# Patient Record
Sex: Male | Born: 1955
Health system: Southern US, Community
[De-identification: ages and names within clinical notes are randomized; demographics above are authoritative.]

## PROBLEM LIST (undated history)

## (undated) DIAGNOSIS — I10 Essential (primary) hypertension: Secondary | ICD-10-CM

## (undated) DIAGNOSIS — I2510 Atherosclerotic heart disease of native coronary artery without angina pectoris: Secondary | ICD-10-CM

## (undated) DIAGNOSIS — R112 Nausea with vomiting, unspecified: Secondary | ICD-10-CM

## (undated) DIAGNOSIS — M1990 Unspecified osteoarthritis, unspecified site: Secondary | ICD-10-CM

## (undated) DIAGNOSIS — Z86718 Personal history of other venous thrombosis and embolism: Secondary | ICD-10-CM

## (undated) DIAGNOSIS — G4730 Sleep apnea, unspecified: Secondary | ICD-10-CM

## (undated) DIAGNOSIS — I252 Old myocardial infarction: Secondary | ICD-10-CM

## (undated) DIAGNOSIS — Z9889 Other specified postprocedural states: Secondary | ICD-10-CM

## (undated) HISTORY — DX: Old myocardial infarction: I25.2

## (undated) HISTORY — PX: OTHER SURGICAL HISTORY: SHX169

---

## 2000-10-01 ENCOUNTER — Ambulatory Visit (HOSPITAL_BASED_OUTPATIENT_CLINIC_OR_DEPARTMENT_OTHER): Admission: RE | Admit: 2000-10-01 | Discharge: 2000-10-01 | Payer: Self-pay | Admitting: Family Medicine

## 2002-11-17 ENCOUNTER — Encounter: Payer: Self-pay | Admitting: Family Medicine

## 2002-11-17 ENCOUNTER — Ambulatory Visit (HOSPITAL_COMMUNITY): Admission: RE | Admit: 2002-11-17 | Discharge: 2002-11-17 | Payer: Self-pay | Admitting: Family Medicine

## 2003-12-01 ENCOUNTER — Ambulatory Visit (HOSPITAL_COMMUNITY): Admission: RE | Admit: 2003-12-01 | Discharge: 2003-12-01 | Payer: Self-pay | Admitting: Oncology

## 2003-12-02 ENCOUNTER — Inpatient Hospital Stay (HOSPITAL_COMMUNITY): Admission: EM | Admit: 2003-12-02 | Discharge: 2003-12-09 | Payer: Self-pay

## 2005-03-30 IMAGING — CT CT ANGIO CHEST
2 of 4 series · 19 of 30 positions shown · IV contrast (omnipaque)
Comparison: none

CLINICAL DATA: DVT, abnormal chest x-ray, shortness of breath with exertion.  Constipation, Lesya+ stools.
CHEST CT ANGIO WITH CONTRAST
Multidetector CT imaging of the chest was performed according to the protocol for detection of pulmonary embolism during IV bolus injection of 150 ml Omnipaque 300.  Coronal and sagittal plane reformatted images were also generated.  
There is atelectasis at the right base with a nodular density approximately 14 x 28 mm in cross-section.  It could represent a peripherally located pulmonary infarct.  There is a small associated pleural effusion.  
I do not see evidence for pulmonary embolic disease.  However, given the history of deep venous thrombosis, a pulmonary infarct must be strongly considered.   The cardiac size is mildly increased.  There is no obvious hilar or mediastinal lymphadenopathy.  No other infiltrates or pulmonary densities can be seen.
IMPRESSION
1.  Nodular peripheral wedge-shaped right lower lobe density; pulmonary infarct is not excluded.
2.  No evidence for pulmonary emboli.
3.  Mild cardiomegaly.
ABDOMEN CT WITH CONTRAST
Routine spiral CT of the abdomen was performed following administration of dilute oral contrast as well as intravenous contrast.

[Series 4: pe w/ lower ext · axial · 0.75mm/px · z∈[-524,-46]mm · 13 of 336 slices shown]
[im 26/336  lung]
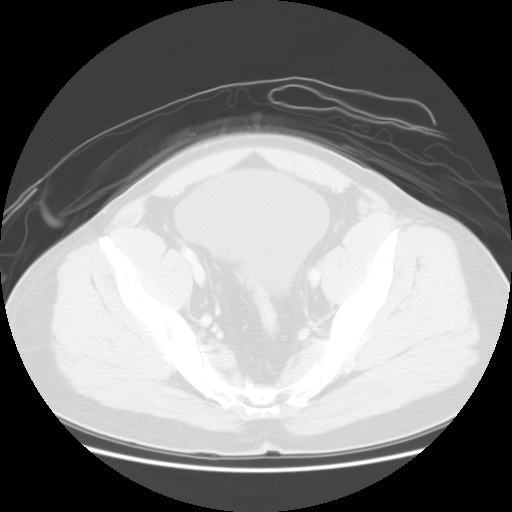
[im 52/336  mediastinal]
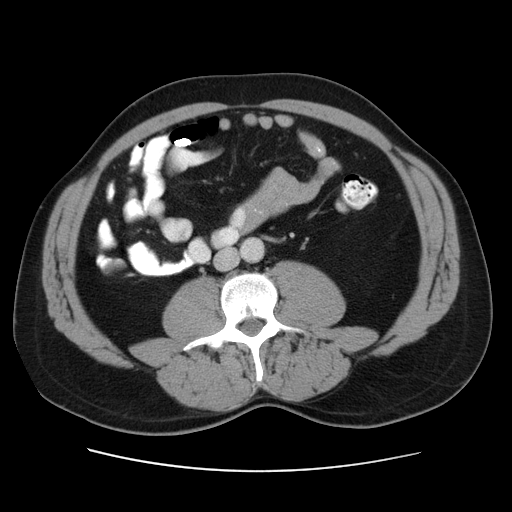
[im 71/336  lung]
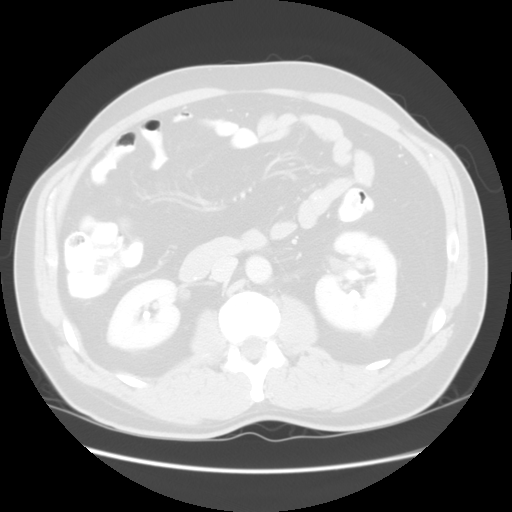
[im 78/336  mediastinal]
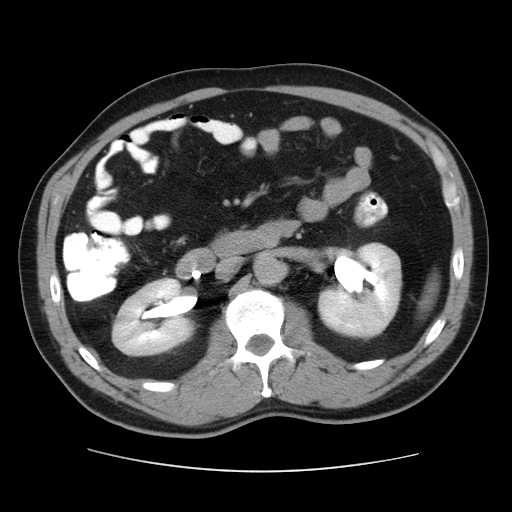
[im 104/336  lung]
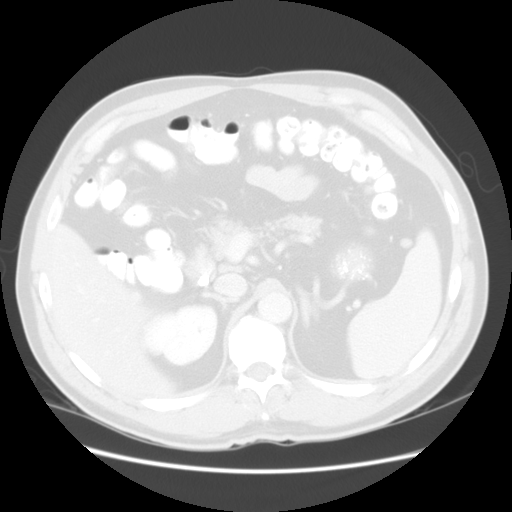
[im 129/336  mediastinal]
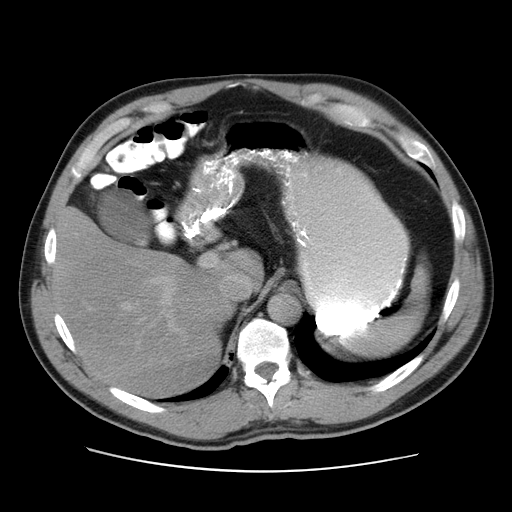
[im 155/336  lung]
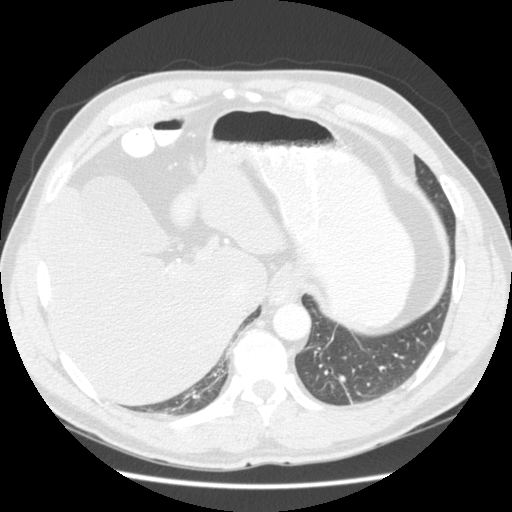
[im 181/336  mediastinal]
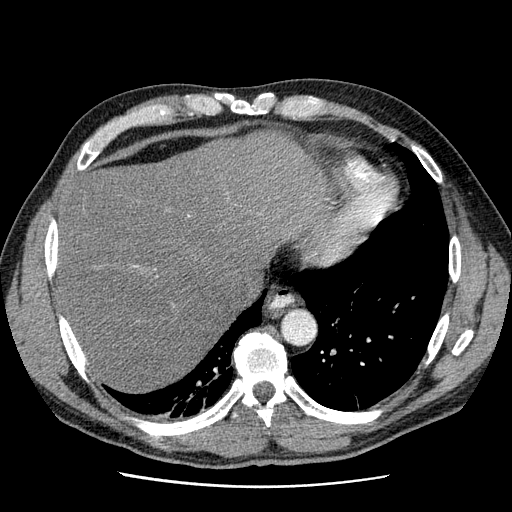
[im 207/336  lung]
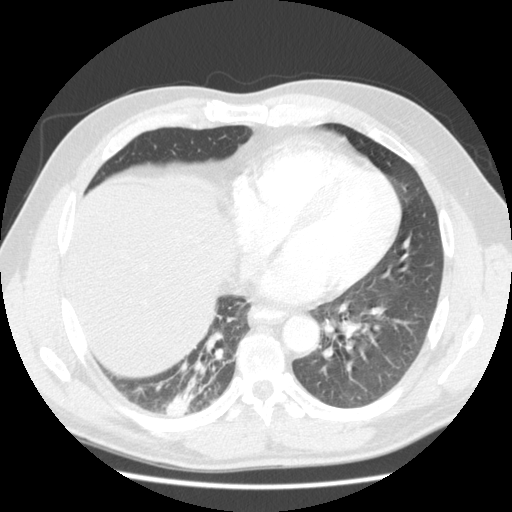
[im 232/336  mediastinal]
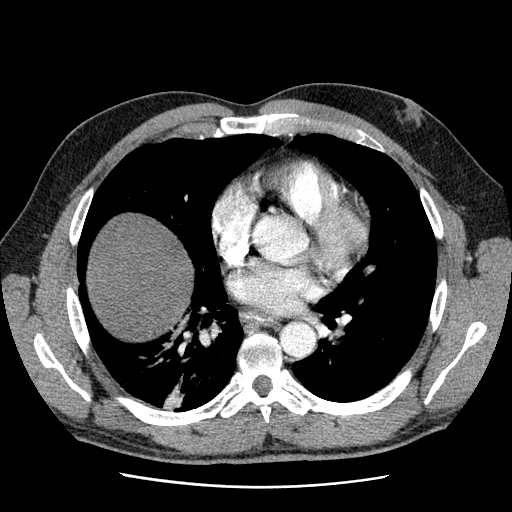
[im 258/336  lung]
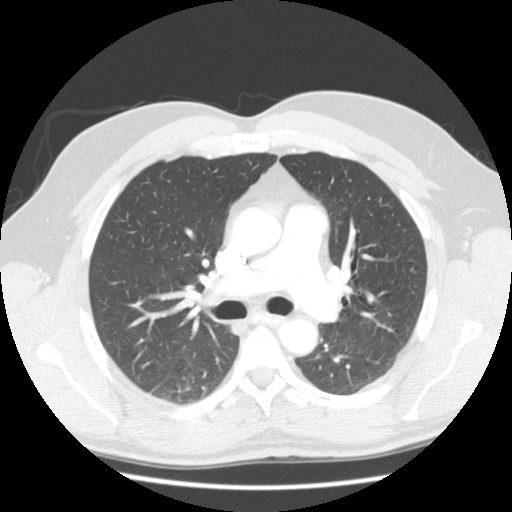
[im 284/336  mediastinal]
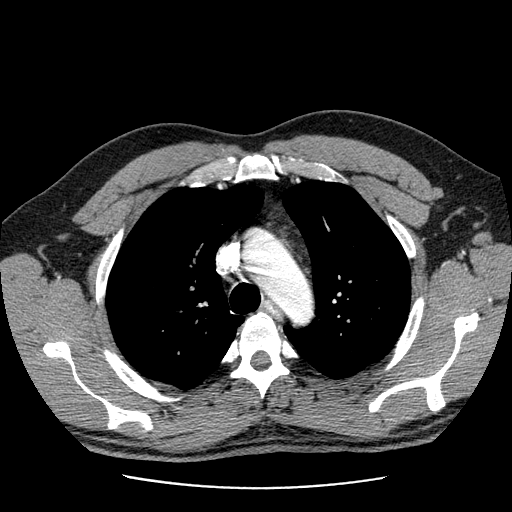
[im 310/336  lung]
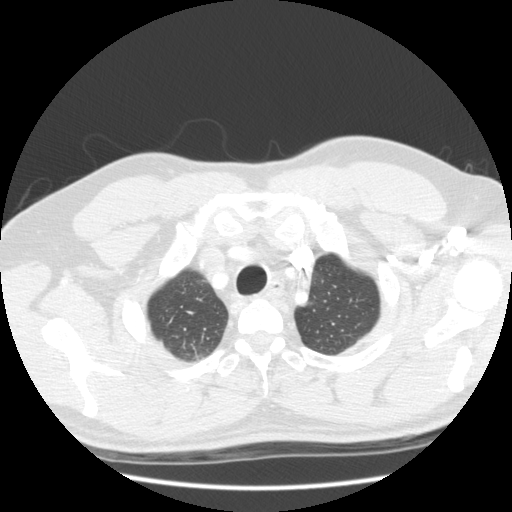

[Series 5: recon 2: pe w/ lower ext · axial · 0.70mm/px · z∈[-588,-246]mm · 6 of 261 slices shown]
[im 24/261  lung]
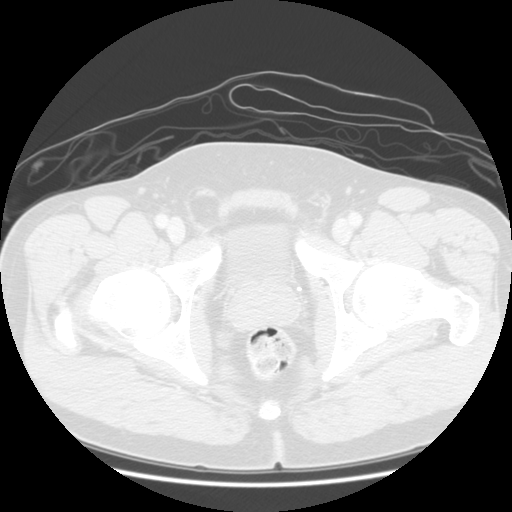
[im 71/261  lung]
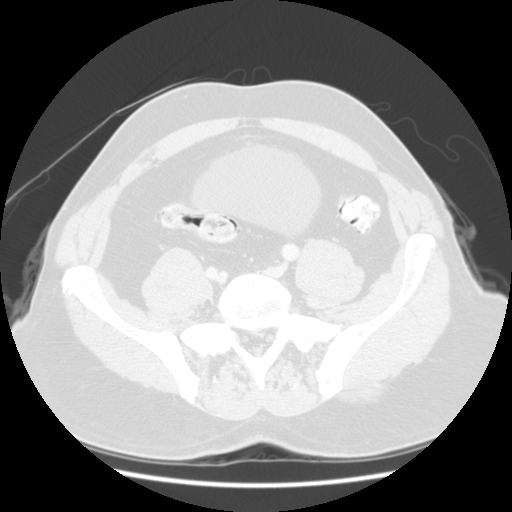
[im 95/261  lung]
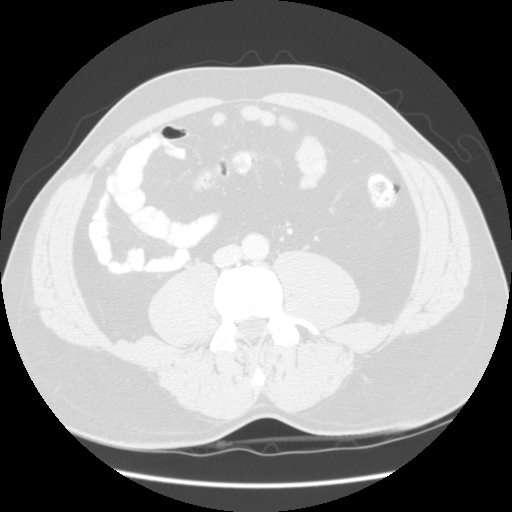
[im 119/261  lung]
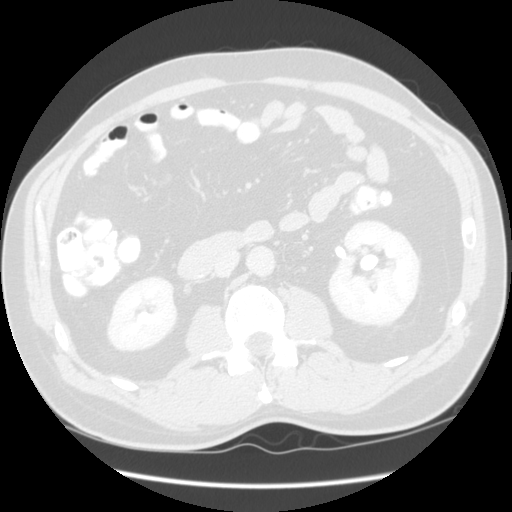
[im 142/261  lung]
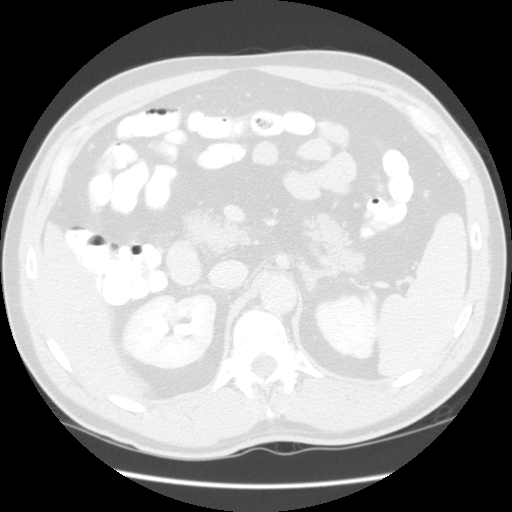
[im 166/261  lung]
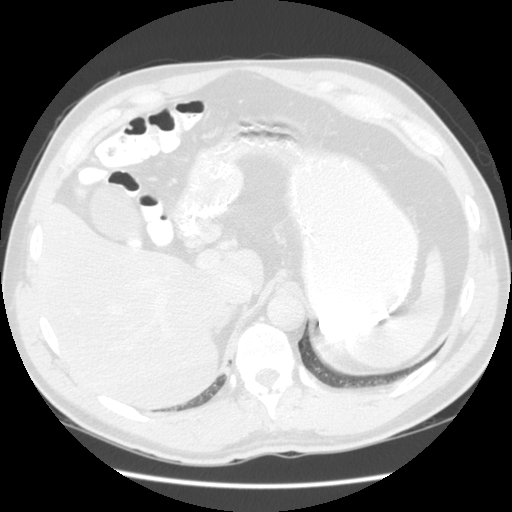

[19 of 30 positions shown; findings below may reference images not displayed]

The abdominal parenchymal organs are normal in appearance. There is no evidence of masses, adenopathy, inflammatory process, or abnormal fluid collections.

IMPRESSION
Normal abdomen CT with contrast.
PELVIS CT WITH CONTRAST
Routine spiral CT of the pelvis was performed. Omnipaque intravenous contrast and oral contrast were administered. 

The pelvic structures are normal in appearance. There is no evidence of masses, adenopathy, inflammatory process, or abnormal fluid collections. 

IMPRESSION
Normal pelvis CT with contrast.

## 2012-08-20 ENCOUNTER — Other Ambulatory Visit: Payer: Self-pay

## 2012-08-20 ENCOUNTER — Inpatient Hospital Stay (HOSPITAL_COMMUNITY)
Admission: EM | Admit: 2012-08-20 | Discharge: 2012-08-24 | DRG: 853 | Disposition: A | Discharge status: Home / Self Care | Payer: BC Managed Care – PPO | Source: Ambulatory Visit | Attending: Cardiology | Admitting: Cardiology

## 2012-08-20 ENCOUNTER — Encounter (HOSPITAL_COMMUNITY): Payer: Self-pay | Admitting: Nurse Practitioner

## 2012-08-20 ENCOUNTER — Encounter (HOSPITAL_COMMUNITY)
Admission: EM | Disposition: A | Discharge status: Home / Self Care | Payer: Self-pay | Source: Home / Self Care | Attending: Cardiology

## 2012-08-20 DIAGNOSIS — I2109 ST elevation (STEMI) myocardial infarction involving other coronary artery of anterior wall: Principal | ICD-10-CM | POA: Diagnosis present

## 2012-08-20 DIAGNOSIS — Z86718 Personal history of other venous thrombosis and embolism: Secondary | ICD-10-CM

## 2012-08-20 DIAGNOSIS — M1990 Unspecified osteoarthritis, unspecified site: Secondary | ICD-10-CM | POA: Diagnosis present

## 2012-08-20 DIAGNOSIS — Z79899 Other long term (current) drug therapy: Secondary | ICD-10-CM

## 2012-08-20 DIAGNOSIS — E785 Hyperlipidemia, unspecified: Secondary | ICD-10-CM

## 2012-08-20 DIAGNOSIS — R7309 Other abnormal glucose: Secondary | ICD-10-CM | POA: Diagnosis not present

## 2012-08-20 DIAGNOSIS — I2510 Atherosclerotic heart disease of native coronary artery without angina pectoris: Secondary | ICD-10-CM

## 2012-08-20 DIAGNOSIS — G4733 Obstructive sleep apnea (adult) (pediatric): Secondary | ICD-10-CM | POA: Diagnosis present

## 2012-08-20 DIAGNOSIS — Z955 Presence of coronary angioplasty implant and graft: Secondary | ICD-10-CM

## 2012-08-20 HISTORY — DX: Atherosclerotic heart disease of native coronary artery without angina pectoris: I25.10

## 2012-08-20 HISTORY — DX: Essential (primary) hypertension: I10

## 2012-08-20 HISTORY — DX: Sleep apnea, unspecified: G47.30

## 2012-08-20 HISTORY — DX: Nausea with vomiting, unspecified: Z98.890

## 2012-08-20 HISTORY — DX: Unspecified osteoarthritis, unspecified site: M19.90

## 2012-08-20 HISTORY — DX: Personal history of other venous thrombosis and embolism: Z86.718

## 2012-08-20 HISTORY — PX: LEFT HEART CATHETERIZATION WITH CORONARY ANGIOGRAM: SHX5451

## 2012-08-20 HISTORY — DX: Nausea with vomiting, unspecified: R11.2

## 2012-08-20 LAB — COMPREHENSIVE METABOLIC PANEL
ALT: 27 U/L (ref 0–53)
AST: 20 U/L (ref 0–37)
Albumin: 4 g/dL (ref 3.5–5.2)
Alkaline Phosphatase: 56 U/L (ref 39–117)
CO2: 22 mEq/L (ref 19–32)
Chloride: 102 mEq/L (ref 96–112)
Potassium: 3.7 mEq/L (ref 3.5–5.1)
Total Bilirubin: 0.3 mg/dL (ref 0.3–1.2)

## 2012-08-20 LAB — CBC WITH DIFFERENTIAL/PLATELET
Eosinophils Relative: 0 % (ref 0–5)
HCT: 43.8 % (ref 39.0–52.0)
Hemoglobin: 15.3 g/dL (ref 13.0–17.0)
Lymphocytes Relative: 10 % — ABNORMAL LOW (ref 12–46)
MCHC: 34.9 g/dL (ref 30.0–36.0)
MCV: 86.9 fL (ref 78.0–100.0)
Monocytes Absolute: 0.6 10*3/uL (ref 0.1–1.0)
Monocytes Relative: 5 % (ref 3–12)
Neutro Abs: 10.3 10*3/uL — ABNORMAL HIGH (ref 1.7–7.7)
WBC: 12.1 10*3/uL — ABNORMAL HIGH (ref 4.0–10.5)

## 2012-08-20 LAB — TROPONIN I: Troponin I: <0.3 ng/mL (ref <0.30)

## 2012-08-20 LAB — APTT: aPTT: 39 seconds — ABNORMAL HIGH (ref 24–37)

## 2012-08-20 SURGERY — LEFT HEART CATHETERIZATION WITH CORONARY ANGIOGRAM
Anesthesia: LOCAL

## 2012-08-20 MED ORDER — SODIUM CHLORIDE 0.9 % IJ SOLN
3.0000 mL | Freq: Two times a day (BID) | INTRAMUSCULAR | Status: DC
  Administered 2012-08-20 – 2012-08-22 (×5): 3 mL via INTRAVENOUS

## 2012-08-20 MED ORDER — BIVALIRUDIN 250 MG IV SOLR
INTRAVENOUS | Status: AC
  Filled 2012-08-20: qty 250

## 2012-08-20 MED ORDER — MIDAZOLAM HCL 2 MG/2ML IJ SOLN
INTRAMUSCULAR | Status: AC
  Filled 2012-08-20: qty 2

## 2012-08-20 MED ORDER — SODIUM CHLORIDE 0.9 % IV SOLN: 1.0000 mL/kg/h | INTRAVENOUS | Status: AC

## 2012-08-20 MED ORDER — NITROGLYCERIN 0.2 MG/ML ON CALL CATH LAB
INTRAVENOUS | Status: AC
  Filled 2012-08-20: qty 1

## 2012-08-20 MED ORDER — HEPARIN SODIUM (PORCINE) 5000 UNIT/ML IJ SOLN
5000.0000 [IU] | Freq: Three times a day (TID) | INTRAMUSCULAR | Status: DC
  Administered 2012-08-20 – 2012-08-23 (×8): 5000 [IU] via SUBCUTANEOUS
  Filled 2012-08-20 (×11): qty 1

## 2012-08-20 MED ORDER — ATORVASTATIN CALCIUM 80 MG PO TABS: 80.0000 mg | ORAL_TABLET | Freq: Every day | ORAL | Status: DC

## 2012-08-20 MED ORDER — ONDANSETRON HCL 4 MG/2ML IJ SOLN: 4.0000 mg | Freq: Four times a day (QID) | INTRAMUSCULAR | Status: DC | PRN

## 2012-08-20 MED ORDER — ACETAMINOPHEN 325 MG PO TABS
650.0000 mg | ORAL_TABLET | ORAL | Status: DC | PRN
  Administered 2012-08-21 – 2012-08-23 (×3): 650 mg via ORAL
  Filled 2012-08-20 (×3): qty 2

## 2012-08-20 MED ORDER — PRASUGREL HCL 10 MG PO TABS
10.0000 mg | ORAL_TABLET | Freq: Every day | ORAL | Status: DC
  Administered 2012-08-21 – 2012-08-24 (×4): 10 mg via ORAL
  Filled 2012-08-20 (×4): qty 1

## 2012-08-20 MED ORDER — ASPIRIN EC 81 MG PO TBEC
81.0000 mg | DELAYED_RELEASE_TABLET | Freq: Every day | ORAL | Status: DC
  Administered 2012-08-21 – 2012-08-24 (×4): 81 mg via ORAL
  Filled 2012-08-20 (×4): qty 1

## 2012-08-20 MED ORDER — ZOLPIDEM TARTRATE 5 MG PO TABS
5.0000 mg | ORAL_TABLET | Freq: Every evening | ORAL | Status: DC | PRN
  Administered 2012-08-21 – 2012-08-22 (×2): 5 mg via ORAL
  Filled 2012-08-20 (×3): qty 1

## 2012-08-20 MED ORDER — HEPARIN (PORCINE) IN NACL 2-0.9 UNIT/ML-% IJ SOLN
INTRAMUSCULAR | Status: AC
  Filled 2012-08-20: qty 1000

## 2012-08-20 MED ORDER — ALPRAZOLAM 0.25 MG PO TABS: 0.2500 mg | ORAL_TABLET | Freq: Two times a day (BID) | ORAL | Status: DC | PRN

## 2012-08-20 MED ORDER — SODIUM CHLORIDE 0.9 % IJ SOLN: 3.0000 mL | INTRAMUSCULAR | Status: DC | PRN

## 2012-08-20 MED ORDER — ASPIRIN 81 MG PO CHEW: 81.0000 mg | CHEWABLE_TABLET | Freq: Every day | ORAL | Status: DC

## 2012-08-20 MED ORDER — ATORVASTATIN CALCIUM 80 MG PO TABS
80.0000 mg | ORAL_TABLET | Freq: Every day | ORAL | Status: DC
  Filled 2012-08-20: qty 1

## 2012-08-20 MED ORDER — METOPROLOL TARTRATE 12.5 MG HALF TABLET
12.5000 mg | ORAL_TABLET | Freq: Two times a day (BID) | ORAL | Status: DC
  Administered 2012-08-20 – 2012-08-24 (×8): 12.5 mg via ORAL
  Filled 2012-08-20 (×10): qty 1

## 2012-08-20 MED ORDER — NITROGLYCERIN 0.4 MG SL SUBL: 0.4000 mg | SUBLINGUAL_TABLET | SUBLINGUAL | Status: DC | PRN

## 2012-08-20 MED ORDER — SODIUM CHLORIDE 0.9 % IV SOLN: 250.0000 mL | INTRAVENOUS | Status: DC | PRN

## 2012-08-20 MED ORDER — HYDROMORPHONE HCL PF 2 MG/ML IJ SOLN
INTRAMUSCULAR | Status: AC
  Filled 2012-08-20: qty 1

## 2012-08-20 MED ORDER — PRASUGREL HCL 10 MG PO TABS
ORAL_TABLET | ORAL | Status: AC
  Filled 2012-08-20: qty 6

## 2012-08-20 MED ORDER — FENTANYL CITRATE 0.05 MG/ML IJ SOLN
INTRAMUSCULAR | Status: AC
  Filled 2012-08-20: qty 2

## 2012-08-20 MED ORDER — OXYCODONE-ACETAMINOPHEN 5-325 MG PO TABS: 1.0000 | ORAL_TABLET | ORAL | Status: DC | PRN

## 2012-08-20 MED ORDER — LIDOCAINE HCL (PF) 1 % IJ SOLN
INTRAMUSCULAR | Status: AC
  Filled 2012-08-20: qty 30

## 2012-08-20 NOTE — H&P (Signed)
Patient ID: David Pena MRN: [id number], DOB/AGE: Nov 06, 1955   Admit date: 08/20/2012   Primary Physician: No primary provider on file. Primary Cardiologist: New - seen by P. [place], MD  Pt. Profile:  57 y/o male w/o prior cardiac hx who presented to ED with chest pain and anterior ST elevation.  Problem List  Past Medical History  Diagnosis Date  . Sleep apnea   . History of DVT of lower extremity     a. RLE following knee surgery in 2005  . Osteoarthritis     a. s/p arthroscopic surgery - R knee    Past Surgical History  Procedure Date  . Right knee arthroscopy     a. 2005    Allergies  No Known Allergies  HPI  57 y/o male w/o male w/o prior cardiac hx.  He had prior w/u @ [facility] a few yrs ago for dyspnea, including an echo, that was reportedly nl.  He takes no medicines @ home.  Over the past few months, he has noted DOE and lightheadedness after walking up stairs.  He was otw in his usoh until today @ 2:15 PM when he was walking @ [employer] while carrying his 37 month old grandson and developed acute onset of chest pain, dyspnea, and diaphoresis.  EMS was called and he was found to have significant anterior ST elevation with inf ST depression.  Code STEMI was called and pt was taken to the [facility] cath lab for emergent catheterization.  He continues to c/o chest pain.  Home Medications  None  Family History  Family History  Problem Relation Age of Onset  . Heart failure Father     died in his 55's.  . High Cholesterol Mother     alive @ 84.   Social History  History   Social History  . Marital Status: Married    Spouse Name: N/A    Number of Children: N/A  . Years of Education: N/A   Occupational History  . Not on file.   Social History Main Topics  . Smoking status: Never Smoker   . Smokeless tobacco: Not on file  . Alcohol Use: No  . Drug Use: No  . Sexually Active: Not on file   Other Topics Concern  . Not on file   Social History  Narrative   Lives in [place] with his wife.  Works as a Education officer, environmental.    Review of Systems General:  No chills, fever, night sweats or weight changes.  Cardiovascular:  +++ chest pain, +++ dyspnea on exertion, edema, orthopnea, palpitations, paroxysmal nocturnal dyspnea. Dermatological: No rash, lesions/masses Respiratory: No cough. Urologic: No hematuria, dysuria Abdominal:   No nausea, vomiting, diarrhea, bright red blood per rectum, melena, or hematemesis Neurologic:  No visual changes, wkns, changes in mental status. All other systems reviewed and are otherwise negative except as noted above.  Physical Exam  Height 5\' 7"  (1.702 m), weight 197 lb (89.359 kg).  General: Pleasant, NAD Psych: Normal affect. Neuro: Alert and oriented X 3. Moves all extremities spontaneously. HEENT: Normal  Neck: Supple without bruits or JVD. Lungs:  Resp regular and unlabored, CTA. Heart: RRR no s3, s4, or murmurs. Abdomen: Soft, non-tender, non-distended, BS + x 4.  Extremities: No clubbing, cyanosis or edema. DP/PT/Radials 2+ and equal bilaterally.  Labs  Pending   Radiology/Studies  No results found.  ECG  Rsr, 76, 4mm ST elevation in V2-V4 with ST depression in II, III, aVF.  ASSESSMENT AND PLAN  1.  Acute Ant STEMI/CAD:  Cath/PCI ongoing.  Plan admission to CCU with addition of asa, bb, statin, eventual cardiac rehab.  F/u Echo.  2.  Lipids:  ? Status.  Check lipids/lft's.  Add high-potency statin.  3.  OSA:  Cont CPAP.  Signed, Nicolasa Ducking, NP 08/20/2012, 4:46 PM Patient seen and examined and history reviewed. Agree with above findings and plan. 57 yo WM with no history of CAD presents with acute onset of chest pain. Initial Ecg was fairly normal. His pain worsened associated with diaphoresis and repeat Ecg showed 4 mm ST elevation in the anterior leads. Will proceed with emergent cardiac cath +/- PCI. Emergent consent obtained.  Thedora Hinders 08/20/2012 6:03 PM

## 2012-08-20 NOTE — CV Procedure (Signed)
Cardiac Catheterization Procedure Note  Name: David Pena MRN: [id number] DOB: 08/04/1956  Procedure: Left Heart Cath, Selective Coronary Angiography, LV angiography, PTCA and stenting of the proximal LAD  Indication:  57 year old white male presented with acute chest pain. Initial ECG showed subtle ST elevation in the inferior leads. The patient's chest pain intensified and repeat ECG showed 4 mm ST segment elevation in the anterior leads.  Procedural Details:  The right wrist was prepped, draped, and anesthetized with 1% lidocaine. Using the modified Seldinger technique, a 6 French sheath was introduced into the right radial artery. 3 mg of verapamil was administered through the sheath, weight-based Angiomax was administered intravenously. Standard Judkins catheters were used for selective coronary angiography and left ventriculography. Catheter exchanges were performed over an exchange length guidewire.  PROCEDURAL FINDINGS Hemodynamics: AO 132/82 with a mean of 104 mmHg LV 127/12 mmHg   Coronary angiography: Coronary dominance: right  Left mainstem: Normal.  Left anterior descending (LAD): The LAD is occluded after the takeoff of a large high diagonal branch. There is no collateral flow.  Left circumflex (LCx): The left circumflex has diffuse 30% disease in the proximal vessel. After a large marginal branch it then trifurcates distally. The second marginal branches small in caliber and has a 90% stenosis.  Right coronary artery (RCA): The right coronary is a dominant vessel. And has diffuse disease in the proximal and mid vessel up to 30%. The PDA is a relatively large branch with a focal 90% stenosis.  Left ventriculography: Performed postintervention, Left ventricular systolic function is overall normal, LVEF is estimated at 55%, there is mild anterior wall hypokinesis, there is no significant mitral regurgitation   PCI Note:  Following the diagnostic procedure, the decision  was made to proceed with PCI. The patient was given 60 mg of oral Effient.  Once a therapeutic ACT was achieved, a 6 [place] XB LAD 3.5 guide catheter was inserted.  A pro-water coronary guidewire was used to cross the lesion.  With crossing the lesion with a wire there was reperfusion. The LAD was a very large vessel. There was segmental disease extending down to the second diagonal branch. The lesion was predilated with a 2.5 mm compliant balloon.  The lesion was then stented with a 3.0 x 33 mm Xience Xpedition stent.  The stent was postdilated with a 3.25 mm noncompliant balloon.  Following PCI, there was 0% residual stenosis and TIMI-3 flow. There was some pinching of the ostium of the second diagonal but there was still TIMI-3 flow down the diagonal. Final angiography confirmed an excellent result. The patient tolerated the procedure well. There were no immediate procedural complications. A TR band was used for radial hemostasis. The patient was transferred to the post catheterization recovery area for further monitoring. He was pain-free at the end of the procedure.  PCI Data: Vessel - LAD/Segment - proximal Percent Stenosis (pre)  100% TIMI-flow 0 Stent 3.0 x 33 mm Xience Xpedition Percent Stenosis (post) 0% TIMI-flow (post) 3  Final Conclusions:   1. Three-vessel obstructive coronary disease. The culprit lesion is the proximal LAD which was occluded. There is a severe focal stenosis in the PDA which is a moderate-sized vessel. The second marginal vessel has a severe stenosis as well but is a very small caliber vessel. 2. Overall well preserved left ventricular function. 3. Successful stenting of the proximal to mid LAD with a drug-eluting stent.   Recommendations:  Continue aspirin and Effient for at least one year. We'll treat  with statin therapy and beta blockers. I would consider stenting of the PDA once he has stabilized from his acute infarct.  Theron Arista [facility] 08/20/2012, 4:05  PM

## 2012-08-21 DIAGNOSIS — I219 Acute myocardial infarction, unspecified: Secondary | ICD-10-CM

## 2012-08-21 DIAGNOSIS — I2109 ST elevation (STEMI) myocardial infarction involving other coronary artery of anterior wall: Secondary | ICD-10-CM

## 2012-08-21 LAB — BASIC METABOLIC PANEL
CO2: 25 mEq/L (ref 19–32)
Calcium: 9.3 mg/dL (ref 8.4–10.5)
Creatinine, Ser: 0.76 mg/dL (ref 0.50–1.35)
GFR calc non Af Amer: >90 mL/min (ref >90)

## 2012-08-21 LAB — CBC
MCH: 30.4 pg (ref 26.0–34.0)
MCHC: 34.7 g/dL (ref 30.0–36.0)
MCV: 87.7 fL (ref 78.0–100.0)
Platelets: 249 10*3/uL (ref 150–400)
RDW: 12.9 % (ref 11.5–15.5)

## 2012-08-21 LAB — LIPID PANEL
LDL Cholesterol: 126 mg/dL — ABNORMAL HIGH (ref 0–99)
Triglycerides: 140 mg/dL (ref <150)
VLDL: 28 mg/dL (ref 0–40)

## 2012-08-21 LAB — HEMOGLOBIN A1C: Hgb A1c MFr Bld: 6.3 % — ABNORMAL HIGH (ref <5.7)

## 2012-08-21 LAB — TROPONIN I: Troponin I: 5.44 ng/mL (ref <0.30)

## 2012-08-21 MED ORDER — LISINOPRIL 10 MG PO TABS
10.0000 mg | ORAL_TABLET | Freq: Every day | ORAL | Status: DC
  Administered 2012-08-21: 10 mg via ORAL
  Filled 2012-08-21 (×2): qty 1

## 2012-08-21 NOTE — Progress Notes (Signed)
Patient ID: David Pena, male   DOB: 1956/03/22, 57 y.o.   MRN: [id number]   Patient Name: Bralon Antkowiak Date of Encounter: 08/21/2012    SUBJECTIVE  Patient without complaint this morning including chest pain or shortness of breath. Portia, RN confirms and states that his heart rate was in the low 50s during the night. EKG is back to normal.  CURRENT MEDS    . aspirin EC  81 mg Oral Daily  . heparin  5,000 Units Subcutaneous Q8H  . metoprolol tartrate  12.5 mg Oral BID  . prasugrel  10 mg Oral Daily  . sodium chloride  3 mL Intravenous Q12H    OBJECTIVE  Filed Vitals:   08/21/12 0500 08/21/12 0600 08/21/12 0700 08/21/12 0800  BP: 141/88 141/84 132/86 136/89  Pulse: 60 61 66 69  Temp:    98.7 F (37.1 C)  TempSrc:    Oral  Resp: 17 16  18   Height:      Weight: 195 lb 8.8 oz (88.7 kg)     SpO2: 93% 97% 94% 95%    Intake/Output Summary (Last 24 hours) at 08/21/12 0913 Last data filed at 08/21/12 0600  Gross per 24 hour  Intake 1055.6 ml  Output   1500 ml  Net -444.4 ml   Filed Weights   08/20/12 1500 08/20/12 1720 08/21/12 0500  Weight: 197 lb (89.359 kg) 204 lb 2.3 oz (92.6 kg) 195 lb 8.8 oz (88.7 kg)    PHYSICAL EXAM  General: Pleasant, NAD. Neuro: Alert and oriented X 3. Moves all extremities spontaneously. Psych: Normal affect. HEENT:  Normal  Neck: Supple without bruits or JVD. Lungs:  Resp regular and unlabored, CTA. Heart: RRR no s3, s4, or murmurs. Abdomen: Soft, non-tender, non-distended, BS + x 4.  Extremities: No clubbing, cyanosis or edema. DP/PT/Radials 2+ and equal bilaterally.  Accessory Clinical Findings  CBC  Basename 08/21/12 0400 08/20/12 1802  WBC 9.6 12.1*  NEUTROABS -- 10.3*  HGB 14.8 15.3  HCT 42.7 43.8  MCV 87.7 86.9  PLT 249 222   Basic Metabolic Panel  Basename 08/21/12 0400 08/20/12 1802  NA 139 138  K 3.5 3.7  CL 104 102  CO2 25 22  GLUCOSE 141* 185*  BUN 14 14  CREATININE 0.76 0.69  CALCIUM 9.3 9.4  MG -- --    PHOS -- --   Liver Function Tests  [facility] 08/20/12 1802  AST 20  ALT 27  ALKPHOS 56  BILITOT 0.3  PROT 7.1  ALBUMIN 4.0   No results found for this basename: LIPASE:2,AMYLASE:2 in the last 72 hours Cardiac Enzymes  Basename 08/21/12 0400 08/20/12 1802  CKTOTAL -- --  CKMB -- --  CKMBINDEX -- --  TROPONINI 5.44* <0.30   BNP No components found with this basename: POCBNP:3 D-Dimer No results found for this basename: DDIMER:2 in the last 72 hours Hemoglobin A1C  Basename 08/20/12 1802  HGBA1C 6.3*   Fasting Lipid Panel  Basename 08/21/12 0400  CHOL 185  HDL 31*  LDLCALC 126*  TRIG 140  CHOLHDL 6.0  LDLDIRECT --   Thyroid Function Tests  Basename 08/20/12 1802  TSH 0.742  T4TOTAL --  T3FREE --  THYROIDAB --    TELE  Normal sinus rhythm, rate 78 beats a minute  ECG  Normal sinus rhythm, ST segment changes resolved.  Radiology/Studies  No results found.  ASSESSMENT AND PLAN  Principal Problem:  *ST elevation myocardial infarction (STEMI) of anterior Leva Baine Active Problems:  OSA (  obstructive sleep apnea)    He is status post anterior Yaritzel Stange MI and drug-eluting stent to the LAD by Dr. [place]. He has a tight PDA lesion that will be intervened upon on Monday. He clearly has hypertension and we'll add low-dose ACE inhibitor. Long discussion about healthy lifestyle including exercise, balance work schedule, and taking his meds.  Signed, Valera Castle MD

## 2012-08-21 NOTE — Progress Notes (Signed)
  Echocardiogram 2D Echocardiogram has been performed.  David Pena 08/21/2012, 6:00 PM

## 2012-08-21 NOTE — Progress Notes (Signed)
CARDIAC REHAB PHASE I   PRE:  Rate/Rhythm: 67 sinus rhythm  BP:  Supine:   Sitting: 136/79  Standing:    SaO2: 95%ra  MODE:  Ambulation: 350 ft   POST:  Rate/Rhythem: 65 sinus  BP:  Supine:   Sitting: 148/80  Standing:    SaO2: 99%ra  1345-1403 Pt ambulated in hallway without difficulty,  Pt denies cp or dyspnea with ambulation.  Pt returned to chair with call light in reach family at bedside. Education began with basic MI information, stent teaching.  MI booklet given.    [place], [place]

## 2012-08-22 MED ORDER — SODIUM CHLORIDE 0.9 % IJ SOLN
3.0000 mL | Freq: Two times a day (BID) | INTRAMUSCULAR | Status: DC
  Administered 2012-08-22: 3 mL via INTRAVENOUS

## 2012-08-22 MED ORDER — LISINOPRIL 5 MG PO TABS: 5.0000 mg | ORAL_TABLET | Freq: Every day | ORAL | Status: DC

## 2012-08-22 MED ORDER — SODIUM CHLORIDE 0.9 % IJ SOLN: 3.0000 mL | INTRAMUSCULAR | Status: DC | PRN

## 2012-08-22 MED ORDER — SODIUM CHLORIDE 0.9 % IV SOLN: 250.0000 mL | INTRAVENOUS | Status: DC | PRN

## 2012-08-22 MED ORDER — SODIUM CHLORIDE 0.9 % IV SOLN
1.0000 mL/kg/h | INTRAVENOUS | Status: DC
  Administered 2012-08-23: 1 mL/kg/h via INTRAVENOUS

## 2012-08-22 MED ORDER — DIAZEPAM 5 MG PO TABS
5.0000 mg | ORAL_TABLET | ORAL | Status: AC
  Administered 2012-08-23: 5 mg via ORAL
  Filled 2012-08-22: qty 1

## 2012-08-22 MED ORDER — ATORVASTATIN CALCIUM 40 MG PO TABS
40.0000 mg | ORAL_TABLET | Freq: Every day | ORAL | Status: DC
  Administered 2012-08-22 – 2012-08-23 (×2): 40 mg via ORAL
  Filled 2012-08-22 (×5): qty 1

## 2012-08-22 MED ORDER — ASPIRIN 81 MG PO CHEW
324.0000 mg | CHEWABLE_TABLET | ORAL | Status: AC
  Administered 2012-08-23: 324 mg via ORAL
  Filled 2012-08-22: qty 4

## 2012-08-22 NOTE — Progress Notes (Signed)
Patient ID: David Pena, male   DOB: 02/28/1956, 56 y.o.   MRN: [id number]   Patient Name: David Pena Date of Encounter: 08/22/2012    SUBJECTIVE  No complaints this morning. Per nursing staff, blood pressure became low last evening. Blood pressure 79/46 her this morning. He received 10 mg of lisinopril yesterday. LDL 126.  CURRENT MEDS    . aspirin EC  81 mg Oral Daily  . atorvastatin  40 mg Oral q1800  . heparin  5,000 Units Subcutaneous Q8H  . lisinopril  5 mg Oral Daily  . metoprolol tartrate  12.5 mg Oral BID  . prasugrel  10 mg Oral Daily  . sodium chloride  3 mL Intravenous Q12H    OBJECTIVE  Filed Vitals:   08/22/12 0500 08/22/12 0600 08/22/12 0700 08/22/12 0800  BP: 90/66 85/47 79/46 101/63  Pulse: 62 56 56 69  Temp: 98.1 F (36.7 C)   97.9 F (36.6 C)  TempSrc: Oral   Oral  Resp:      Height:      Weight: 197 lb 8.5 oz (89.6 kg)     SpO2: 94% 92% 95% 96%    Intake/Output Summary (Last 24 hours) at 08/22/12 1029 Last data filed at 08/22/12 0800  Gross per 24 hour  Intake    960 ml  Output   1775 ml  Net   -815 ml   Filed Weights   08/20/12 1720 08/21/12 0500 08/22/12 0500  Weight: 204 lb 2.3 oz (92.6 kg) 195 lb 8.8 oz (88.7 kg) 197 lb 8.5 oz (89.6 kg)    PHYSICAL EXAM  General: Pleasant, NAD. Neuro: Alert and oriented X 3. Moves all extremities spontaneously. Psych: Normal affect. HEENT:  Normal  Neck: Supple without bruits or JVD. Lungs:  Resp regular and unlabored, CTA. Heart: RRR no s3, s4, or murmurs. Abdomen: Soft, non-tender, non-distended, BS + x 4.  Extremities: No clubbing, cyanosis or edema. DP/PT/Radials 2+ and equal bilaterally.  Accessory Clinical Findings  CBC  Basename 08/21/12 0400 08/20/12 1802  WBC 9.6 12.1*  NEUTROABS -- 10.3*  HGB 14.8 15.3  HCT 42.7 43.8  MCV 87.7 86.9  PLT 249 222   Basic Metabolic Panel  Basename 08/21/12 0400 08/20/12 1802  NA 139 138  K 3.5 3.7  CL 104 102  CO2 25 22  GLUCOSE 141*  185*  BUN 14 14  CREATININE 0.76 0.69  CALCIUM 9.3 9.4  MG -- --  PHOS -- --   Liver Function Tests  Basename 08/20/12 1802  AST 20  ALT 27  ALKPHOS 56  BILITOT 0.3  PROT 7.1  ALBUMIN 4.0   No results found for this basename: LIPASE:2,AMYLASE:2 in the last 72 hours Cardiac Enzymes  Basename 08/21/12 0400 08/20/12 1802  CKTOTAL -- --  CKMB -- --  CKMBINDEX -- --  TROPONINI 5.44* <0.30   BNP No components found with this basename: POCBNP:3 D-Dimer No results found for this basename: DDIMER:2 in the last 72 hours Hemoglobin A1C  Basename 08/20/12 1802  HGBA1C 6.3*   Fasting Lipid Panel  Basename 08/21/12 0400  CHOL 185  HDL 31*  LDLCALC 126*  TRIG 140  CHOLHDL 6.0  LDLDIRECT --   Thyroid Function Tests  Basename 08/20/12 1802  TSH 0.742  T4TOTAL --  T3FREE --  THYROIDAB --    TELE  ECG   Radiology/Studies  No results found.  ASSESSMENT AND PLAN  Principal Problem:  *ST elevation myocardial infarction (STEMI) of anterior David Pena Active   Problems:  OSA (obstructive sleep apnea)    His blood pressure will not tolerate lisinopril. We'll discontinue for now. Will keep him n.p.o. for PCI with Dr. Jordan tomorrow. Discussed with family.  Statin initiation today at 40 mg of atorvastatin. Explained to patient and family.  His hemoglobin A1c is 6.3% and blood sugars have been elevated here. We'll consult diabetic teaching service. Patient and family in agreement.  Signed, Trust Crago MD  

## 2012-08-23 ENCOUNTER — Encounter (HOSPITAL_COMMUNITY)
Admission: EM | Disposition: A | Discharge status: Home / Self Care | Payer: Self-pay | Source: Home / Self Care | Attending: Cardiology

## 2012-08-23 ENCOUNTER — Ambulatory Visit (HOSPITAL_COMMUNITY)
Admission: RE | Admit: 2012-08-23 | Payer: BC Managed Care – PPO | Source: Ambulatory Visit | Admitting: Cardiovascular Disease

## 2012-08-23 ENCOUNTER — Other Ambulatory Visit: Payer: Self-pay

## 2012-08-23 DIAGNOSIS — I2510 Atherosclerotic heart disease of native coronary artery without angina pectoris: Secondary | ICD-10-CM

## 2012-08-23 HISTORY — PX: PERCUTANEOUS CORONARY STENT INTERVENTION (PCI-S): SHX5485

## 2012-08-23 LAB — GLUCOSE, CAPILLARY

## 2012-08-23 LAB — CBC
HCT: 43.1 % (ref 39.0–52.0)
MCHC: 34.1 g/dL (ref 30.0–36.0)
RDW: 12.9 % (ref 11.5–15.5)

## 2012-08-23 LAB — CREATININE, SERUM
Creatinine, Ser: 0.77 mg/dL (ref 0.50–1.35)
GFR calc non Af Amer: >90 mL/min (ref >90)

## 2012-08-23 SURGERY — PERCUTANEOUS CORONARY STENT INTERVENTION (PCI-S)
Anesthesia: LOCAL

## 2012-08-23 MED ORDER — FENTANYL CITRATE 0.05 MG/ML IJ SOLN
INTRAMUSCULAR | Status: AC
  Filled 2012-08-23: qty 2

## 2012-08-23 MED ORDER — HEPARIN (PORCINE) IN NACL 2-0.9 UNIT/ML-% IJ SOLN
INTRAMUSCULAR | Status: AC
  Filled 2012-08-23: qty 1000

## 2012-08-23 MED ORDER — BIVALIRUDIN 250 MG IV SOLR
INTRAVENOUS | Status: AC
  Filled 2012-08-23: qty 250

## 2012-08-23 MED ORDER — ALUM & MAG HYDROXIDE-SIMETH 200-200-20 MG/5ML PO SUSP: 30.0000 mL | ORAL | Status: DC | PRN

## 2012-08-23 MED ORDER — HEPARIN SODIUM (PORCINE) 5000 UNIT/ML IJ SOLN
5000.0000 [IU] | Freq: Three times a day (TID) | INTRAMUSCULAR | Status: DC
  Administered 2012-08-23 – 2012-08-24 (×2): 5000 [IU] via SUBCUTANEOUS
  Filled 2012-08-23 (×5): qty 1

## 2012-08-23 MED ORDER — VERAPAMIL HCL 2.5 MG/ML IV SOLN
INTRAVENOUS | Status: AC
  Filled 2012-08-23: qty 2

## 2012-08-23 MED ORDER — LIDOCAINE HCL (PF) 1 % IJ SOLN
INTRAMUSCULAR | Status: AC
  Filled 2012-08-23: qty 30

## 2012-08-23 MED ORDER — SODIUM CHLORIDE 0.9 % IV SOLN
1.0000 mL/kg/h | INTRAVENOUS | Status: AC
  Administered 2012-08-23 (×2): 1 mL/kg/h via INTRAVENOUS

## 2012-08-23 MED ORDER — MIDAZOLAM HCL 2 MG/2ML IJ SOLN
INTRAMUSCULAR | Status: AC
  Filled 2012-08-23: qty 2

## 2012-08-23 MED ORDER — NITROGLYCERIN 0.2 MG/ML ON CALL CATH LAB
INTRAVENOUS | Status: AC
  Filled 2012-08-23: qty 1

## 2012-08-23 MED FILL — Dextrose Inj 5%: INTRAVENOUS | Qty: 50 | Status: AC

## 2012-08-23 NOTE — Progress Notes (Signed)
Pt. Placed himself on his own CPAP & mask from home. Pt. Is tolerating CPAP well at this time. All vitals are within normal limits.

## 2012-08-23 NOTE — Progress Notes (Signed)
TR BAND REMOVAL  LOCATION:  right radial  DEFLATED PER PROTOCOL:  yes  TIME BAND OFF / DRESSING APPLIED:   1430   SITE UPON ARRIVAL:   Level 0  SITE AFTER BAND REMOVAL:  Level 0  REVERSE ALLEN'S TEST:    positive  CIRCULATION SENSATION AND MOVEMENT:  Within Normal Limits  yes  COMMENTS:     

## 2012-08-23 NOTE — CV Procedure (Signed)
   CARDIAC CATH NOTE  Name: David Pena MRN: [id number] DOB: Dec 09, 1955  Procedure: PTCA and stenting of the PDA  Indication: 57 year old white male who presented 3 days prior with an acute ST elevation anterior myocardial infarction. He underwent emergent stenting of the proximal LAD which was occluded. He was also found to have a 90% stenosis in the PDA. He returned today for intervention of this lesion.  Procedural Details: The right wrist was prepped, draped, and anesthetized with 1% lidocaine. Using the modified Seldinger technique, a 6 Fr sheath was introduced into the radial artery. 3 mg verapamil was administered through the radial sheath. Weight-based bivalirudin was given for anticoagulation. Once a therapeutic ACT was achieved, a 6 [place] FR4 guide catheter was inserted.  A pro-water coronary guidewire was used to cross the lesion.  The lesion was predilated with a 2.0 mm balloon.  The lesion was then stented with a 2.25 x 12 mm Promus stent.  The stent was postdilated with a 2.25 mm noncompliant balloon.  Following PCI, there was 0% residual stenosis and TIMI-3 flow. Final angiography confirmed an excellent result. The patient tolerated the procedure well. There were no immediate procedural complications. A TR band was used for radial hemostasis. The patient was transferred to the post catheterization recovery area for further monitoring.  Lesion Data: Vessel: PDA Percent stenosis (pre): 90% TIMI-flow (pre):  3 Stent:  2.25 x 12 mm Promus Percent stenosis (post): 0% TIMI-flow (post): 3  Conclusions: Successful intracoronary stenting of the PDA with a drug-eluting stent.  Recommendations: Continue aspirin indefinitely and for one year. If stable patient should be discharged in the morning.  Theron Arista [facility] 08/23/2012, 11:06 AM

## 2012-08-23 NOTE — Progress Notes (Signed)
Inpatient Diabetes Program Recommendations  AACE/ADA: New Consensus Statement on Inpatient Glycemic Control (2013)  Target Ranges:  Prepandial:   less than 140 mg/dL      Peak postprandial:   less than 180 mg/dL (1-2 hours)      Critically ill patients:  140 - 180 mg/dL   Reason for Visit: MD consult regarding elevated Hgb A1C   Inpatient Diabetes Program Recommendations HgbA1C: Hbg A1C is 6.3 Diet: Has heart healthy diet order.  Added CHO Modified Medium restriction to order.  Note:  Patient has elevated Hbg A1C indicative of probable Pre-diabetes.  Visited patient at bedside.  Patient surrounded by wife and family members.  S/P cardiac cath.  For possible discharge tomorrow.  Patient and wife state that [place], with Cardiac Rehab, did an excellent job advising them of appropriate meal planning, snacks, etc and incorporated CHO management with instructions as well.  Therefore, they do not feel they need to see a dietitian.  He plans to follow-up with Cardiac Rehab as an outpatient when his cardiologist clears him to start.  Encouraged him to follow-up with that plan.  Emphasized that the Cardiac Rehab Program would help him increase his activity and make his body function more efficiently-- improving his glycemic status as well as his cardiac status.  Also encouraged patient to f/u with his PCP, Dr Erling Conte, with [facility].  Stressed need to address his glycemic issues at this stage while it is reversible because once he has full-blown diabetes it is not reversible.  Informed patient/wife regarding instruction available through the [facility] as an outpatient-- should further education be desired or indicated.  Encourage patient/wife to watch diabetes-related videos as desired.    Both patient and wife express confidence in plan with Cardiac Rehab after discharge, f/u with cardiologist, and f/u with PCP.  Appreciated hearing about resources available at [facility] should they  need it. Lemuel Boodram S. Elsie Lincoln, RN, CNS, CDE Inpatient Diabetes Program, team pager [phone]

## 2012-08-23 NOTE — Interval H&P Note (Signed)
History and Physical Interval Note:  08/23/2012 10:26 AM  David Pena  has presented today for surgery, with the diagnosis of CAD  The various methods of treatment have been discussed with the patient and family. After consideration of risks, benefits and other options for treatment, the patient has consented to  Procedure(s) (LRB) with comments: PERCUTANEOUS CORONARY STENT INTERVENTION (PCI-S) (N/A) as a surgical intervention .  The patient's history has been reviewed, patient examined, no change in status, stable for surgery.  I have reviewed the patient's chart and labs.  Questions were answered to the patient's satisfaction.     Theron Arista [facility] 08/23/2012 10:26 AM

## 2012-08-23 NOTE — Progress Notes (Signed)
CARDIAC REHAB PHASE I   PRE:  Rate/Rhythm: 71 SR    BP: sitting 107/69    SaO2:   MODE:  Ambulation: 600 ft   POST:  Rate/Rhythm: 70 SR    BP: sitting 124/82     SaO2:   Tolerated well. HR didn't seem to increase with normal pace walk. No c/o, feels good. Wife present therefore completed ed. Voiced understanding with good questions. Interested in [facility], will send referral to [place] [facility]. [id number]  Harriet Masson CES, ACSM

## 2012-08-23 NOTE — H&P (View-Only) (Signed)
Patient ID: David Pena, male   DOB: 09-17-1955, 57 y.o.   MRN: [id number]   Patient Name: David Pena Date of Encounter: 08/22/2012    SUBJECTIVE  No complaints this morning. Per nursing staff, blood pressure became low last evening. Blood pressure 79/46 her this morning. He received 10 mg of lisinopril yesterday. LDL 126.  CURRENT MEDS    . aspirin EC  81 mg Oral Daily  . atorvastatin  40 mg Oral q1800  . heparin  5,000 Units Subcutaneous Q8H  . lisinopril  5 mg Oral Daily  . metoprolol tartrate  12.5 mg Oral BID  . prasugrel  10 mg Oral Daily  . sodium chloride  3 mL Intravenous Q12H    OBJECTIVE  Filed Vitals:   08/22/12 0500 08/22/12 0600 08/22/12 0700 08/22/12 0800  BP: 90/66 85/47 79/46  101/63  Pulse: 62 56 56 69  Temp: 98.1 F (36.7 C)   97.9 F (36.6 C)  TempSrc: Oral   Oral  Resp:      Height:      Weight: 197 lb 8.5 oz (89.6 kg)     SpO2: 94% 92% 95% 96%    Intake/Output Summary (Last 24 hours) at 08/22/12 1029 Last data filed at 08/22/12 0800  Gross per 24 hour  Intake    960 ml  Output   1775 ml  Net   -815 ml   Filed Weights   08/20/12 1720 08/21/12 0500 08/22/12 0500  Weight: 204 lb 2.3 oz (92.6 kg) 195 lb 8.8 oz (88.7 kg) 197 lb 8.5 oz (89.6 kg)    PHYSICAL EXAM  General: Pleasant, NAD. Neuro: Alert and oriented X 3. Moves all extremities spontaneously. Psych: Normal affect. HEENT:  Normal  Neck: Supple without bruits or JVD. Lungs:  Resp regular and unlabored, CTA. Heart: RRR no s3, s4, or murmurs. Abdomen: Soft, non-tender, non-distended, BS + x 4.  Extremities: No clubbing, cyanosis or edema. DP/PT/Radials 2+ and equal bilaterally.  Accessory Clinical Findings  CBC  Basename 08/21/12 0400 08/20/12 1802  WBC 9.6 12.1*  NEUTROABS -- 10.3*  HGB 14.8 15.3  HCT 42.7 43.8  MCV 87.7 86.9  PLT 249 222   Basic Metabolic Panel  Basename 08/21/12 0400 08/20/12 1802  NA 139 138  K 3.5 3.7  CL 104 102  CO2 25 22  GLUCOSE 141*  185*  BUN 14 14  CREATININE 0.76 0.69  CALCIUM 9.3 9.4  MG -- --  PHOS -- --   Liver Function Tests  [facility] 08/20/12 1802  AST 20  ALT 27  ALKPHOS 56  BILITOT 0.3  PROT 7.1  ALBUMIN 4.0   No results found for this basename: LIPASE:2,AMYLASE:2 in the last 72 hours Cardiac Enzymes  Basename 08/21/12 0400 08/20/12 1802  CKTOTAL -- --  CKMB -- --  CKMBINDEX -- --  TROPONINI 5.44* <0.30   BNP No components found with this basename: POCBNP:3 D-Dimer No results found for this basename: DDIMER:2 in the last 72 hours Hemoglobin A1C  Basename 08/20/12 1802  HGBA1C 6.3*   Fasting Lipid Panel  Basename 08/21/12 0400  CHOL 185  HDL 31*  LDLCALC 126*  TRIG 140  CHOLHDL 6.0  LDLDIRECT --   Thyroid Function Tests  Basename 08/20/12 1802  TSH 0.742  T4TOTAL --  T3FREE --  THYROIDAB --    TELE  ECG   Radiology/Studies  No results found.  ASSESSMENT AND PLAN  Principal Problem:  *ST elevation myocardial infarction (STEMI) of anterior David Pena Active  Problems:  OSA (obstructive sleep apnea)    His blood pressure will not tolerate lisinopril. We'll discontinue for now. Will keep him n.p.o. for PCI with Dr. Swaziland tomorrow. Discussed with family.  Statin initiation today at 40 mg of atorvastatin. Explained to patient and family.  His hemoglobin A1c is 6.3% and blood sugars have been elevated here. We'll consult diabetic teaching service. Patient and family in agreement.  Signed, Valera Castle MD

## 2012-08-23 NOTE — Care Management Note (Signed)
    Page 1 of 1   08/23/2012     10:00:47 AM   CARE MANAGEMENT NOTE 08/23/2012  Patient:  David Pena, David Pena   Account Number:  [id number]  Date Initiated:  08/23/2012  Documentation initiated by:  Junius Creamer  Subjective/Objective Assessment:   adm w mi     Action/Plan:   lives w wife   Anticipated DC Date:     Anticipated DC Plan:  HOME/SELF CARE      DC Planning Services  CM consult      Choice offered to / List presented to:             Status of service:   Medicare Important Message given?   (If response is "NO", the following Medicare IM given date fields will be blank) Date Medicare IM given:   Date Additional Medicare IM given:    Discharge Disposition:    Per UR Regulation:  Reviewed for med. necessity/level of care/duration of stay  If discussed at Long Length of Stay Meetings, dates discussed:    Comments:  1/20 1000 debbie Eulanda Dorion rn,bsn gave pt 30days free effient card and copay assist card.

## 2012-08-24 ENCOUNTER — Encounter (HOSPITAL_COMMUNITY): Payer: Self-pay | Admitting: Nurse Practitioner

## 2012-08-24 DIAGNOSIS — E785 Hyperlipidemia, unspecified: Secondary | ICD-10-CM

## 2012-08-24 DIAGNOSIS — I2510 Atherosclerotic heart disease of native coronary artery without angina pectoris: Secondary | ICD-10-CM

## 2012-08-24 LAB — CBC
MCH: 30.6 pg (ref 26.0–34.0)
MCV: 88.5 fL (ref 78.0–100.0)
Platelets: 216 10*3/uL (ref 150–400)
RBC: 4.71 MIL/uL (ref 4.22–5.81)

## 2012-08-24 LAB — BASIC METABOLIC PANEL
CO2: 25 mEq/L (ref 19–32)
Calcium: 8.9 mg/dL (ref 8.4–10.5)
Creatinine, Ser: 0.79 mg/dL (ref 0.50–1.35)

## 2012-08-24 MED ORDER — LIVING WELL WITH DIABETES BOOK
Freq: Once | Status: AC
  Administered 2012-08-24: 05:00:00
  Filled 2012-08-24 (×2): qty 1

## 2012-08-24 MED ORDER — NITROGLYCERIN 0.4 MG SL SUBL: 0.4000 mg | SUBLINGUAL_TABLET | SUBLINGUAL | Status: DC | PRN

## 2012-08-24 MED ORDER — ATORVASTATIN CALCIUM 80 MG PO TABS: 80.0000 mg | ORAL_TABLET | Freq: Every day | ORAL | Status: DC

## 2012-08-24 MED ORDER — PRASUGREL HCL 10 MG PO TABS: 10.0000 mg | ORAL_TABLET | Freq: Every day | ORAL | Status: DC

## 2012-08-24 MED ORDER — METOPROLOL TARTRATE 25 MG PO TABS: 12.5000 mg | ORAL_TABLET | Freq: Two times a day (BID) | ORAL | Status: DC

## 2012-08-24 MED ORDER — HEART ATTACK BOUNCING BOOK
Freq: Once | Status: AC
  Administered 2012-08-24: 05:00:00
  Filled 2012-08-24 (×2): qty 1

## 2012-08-24 MED ORDER — ASPIRIN 81 MG PO TBEC: 81.0000 mg | DELAYED_RELEASE_TABLET | Freq: Every day | ORAL | Status: AC

## 2012-08-24 MED FILL — Dextrose Inj 5%: INTRAVENOUS | Qty: 50 | Status: AC

## 2012-08-24 NOTE — Progress Notes (Signed)
CARDIAC REHAB PHASE I   PRE:  Rate/Rhythm: 78 SR    BP: sitting 111/61    SaO2:   MODE:  Ambulation: 1000 ft   POST:  Rate/Rhythm: 85 SR    BP: sitting 128/76     SaO2:   Tolerated great. No c/o. Does st wrist is slightly sore. Encouraged pt in balancing life/work/stress relief. Excited to do CRPII. [id number]  Harriet Masson CES, ACSM

## 2012-08-24 NOTE — Plan of Care (Signed)
Problem: Phase III Progression Outcomes Goal: Other Phase III Outcomes/Goals Outcome: Completed/Met Date Met:  08/24/12 Care notes for medications given: Effient, ASA, Metoprolol, & Atorvastatin given. Heart Attack (Bouncing Back) & Living Well w/ Diabetes Books given.

## 2012-08-24 NOTE — Progress Notes (Signed)
Access chart for benefits check of Effient 10mg  PO daily Dalinda Heidt J. Lucretia Roers, RN, BSN, [employer] [phone]

## 2012-08-24 NOTE — Progress Notes (Signed)
Right radial site bruised; area firm beneath bruising. Improvement from beginning of shift. Patient states right arm "feels less tight".

## 2012-08-24 NOTE — Progress Notes (Signed)
    Subjective:  No chest pain, palpitations, or dyspnea.  Objective:  Vital Signs in the last 24 hours: Temp:  [97.4 F (36.3 C)-98.4 F (36.9 C)] 97.4 F (36.3 C) (01/21 0540) Pulse Rate:  [60-76] 76  (01/20 2129) Resp:  [11-22] 16  (01/21 0530) BP: (106-119)/(55-69) 113/65 mmHg (01/20 2130) SpO2:  [92 %-96 %] 95 % (01/21 0540) Weight:  [90.1 kg (198 lb 10.2 oz)] 90.1 kg (198 lb 10.2 oz) (01/21 0036)  Intake/Output from previous day: 01/20 0701 - 01/21 0700 In: 1304.3 [P.O.:480; I.V.:824.3] Out: 800 [Urine:800]  Physical Exam: Pt is alert and oriented, NAD HEENT: normal Neck: JVP - normal Lungs: CTA bilaterally CV: RRR without murmur or gallop Abd: soft, NT, Positive BS, no hepatomegaly Ext: no C/C/E, distal pulses intact and equal. The right wrist is tender to palpation, but there is no hematoma present. Skin: warm/dry no rash   Lab Results:  Basename 08/23/12 1300  WBC 6.5  HGB 14.7  PLT 217    Basename 08/24/12 0450 08/23/12 1300  NA 138 --  K 4.1 --  CL 105 --  CO2 25 --  GLUCOSE 94 --  BUN 20 --  CREATININE 0.79 0.77   No results found for this basename: TROPONINI:2,CK,MB:2 in the last 72 hours  Cardiac Studies: 2-D echo: Study Conclusions  Left ventricle: The cavity size was normal. Wall thickness was increased in a pattern of mild LVH. Systolic function was normal. The estimated ejection fraction was in the range of 55% to 60%. Wall motion was normal; there were no regional wall motion abnormalities. Left ventricular diastolic function parameters were normal.  Tele: Personally reviewed, normal sinus rhythm without significant arrhythmia the  Assessment/Plan:  1. Anterior STEMI. The patient has preserved LV function and he has done very well with an uncomplicated hospital course. He underwent staged PCI of the right PDA yesterday. He should remain on aspirin indefinitely, effient for 12 months at a minimum, low-dose metoprolol, and  atorvastatin. His blood pressure will not tolerate an ACE inhibitor. He will followup with Dr. Sw[place]and. The patient works as a Education officer, environmental and he will remain out of work until his followup office visit when I would anticipate that he will be able to return. We discussed post MI lifestyle recommendations. He will enroll in cardiac rehabilitation. Recommend followup within 2 weeks with Dr. Elvis Coil PA/NP.  2. Hyperlipidemia. Continue atorvastatin.  3. Disposition: Home today.  Tonny Bollman, M.D. 08/24/2012, 6:42 AM

## 2012-08-24 NOTE — Discharge Summary (Signed)
Patient ID: David Pena,  MRN: [id number], DOB/AGE: 1956/03/18 57 y.o.  Admit date: 08/20/2012 Discharge date: 08/24/2012  Primary Care Provider: Terrilee Files, MD Primary Cardiologist: P. Sw[place], MD  Discharge Diagnoses Principal Problem:  *ST elevation myocardial infarction (STEMI) of anterior wall  **S/P PCI/DES to the LAD and R PDA this admission. Active Problems:  CAD (coronary artery disease)  OSA (obstructive sleep apnea)  Hyperlipidemia  Allergies Allergies  Allergen Reactions  . Dilaudid (Hydromorphone)     Night sweats and terrors   Procedures  Cardiac Catheterization and Percutaneous Coronary Intervention 08/20/2012  PROCEDURAL FINDINGS Hemodynamics: AO 132/82 with a mean of 104 mmHg LV 127/12 mmHg              Coronary angiography: Coronary dominance: right  Left mainstem: Normal. Left anterior descending (LAD): The LAD is occluded after the takeoff of a large high diagonal branch. There is no collateral flow.   **The LAD was successfully stented using a 3.0 x 33 mm Xience Xpedition drug-eluting stent.  Left circumflex (LCx): The left circumflex has diffuse 30% disease in the proximal vessel. After a large marginal branch it then trifurcates distally. The second marginal branches small in caliber and has a 90% stenosis. Right coronary artery (RCA): The right coronary is a dominant vessel. And has diffuse disease in the proximal and mid vessel up to 30%. The PDA is a relatively large branch with a focal 90% stenosis.  Left ventriculography: Performed postintervention, Left ventricular systolic function is overall normal, LVEF is estimated at 55%, there is mild anterior wall hypokinesis, there is no significant mitral regurgitation  _____________  2D Echocardiogram 08/22/2012  Study Conclusions  Left ventricle: The cavity size was normal. Wall thickness was increased in a pattern of mild LVH. Systolic function was normal. The estimated ejection fraction was in  the range of 55% to 60%. Wall motion was normal; there were no regional wall motion abnormalities. Left ventricular diastolic function parameters were normal. PROCEDURAL FINDINGS Hemodynamics: AO 132/82 with a mean of 104 mmHg LV 127/12 mmHg _____________  Percutaneous Coronary Intervention 08/23/2012  PDA was successfully stented with a 2.25 x 12 mm Promus stent.  _____________  History of Present Illness  57 y/o male w/o prior cardiac history who was in his USOH until 08/20/2012 when he was walking at [facility] and suddenly developed substernal chest pain, dyspnea, and diaphoresis.  EMS was called and patient was found to have significant anterior ST segment elevation with inferior ST depression. Code STEMI was called and patient was taken to the [facility] cone cath lab for emergent catheterization.  Hospital Course  Patient underwent emergent diagnostic cardiac catheterization revealing a total occlusion of the left anterior descending artery as well as significant stenosis in the right posterior descending artery. The LAD was felt to be the infarct vessel and this was successfully stented using a 3.0 x 33 mm Xience Xpedition drug-eluting stent.  The posterior descending artery stenosis was felt to be significant and decision was made to pursue a staged intervention after recovery from his LAD infarct. Patient was monitored in the coronary intensive care unit over the weekend where he eventually peaked his troponin at 5.44. He was maintained on aspirin, effient, low-dose beta blocker, and statin therapy. His blood pressure was soft and thus we were not able to titrate beta blocker therapy or add an ACE inhibitor. 2-D echocardiogram was performed on January 19, revealing normal LV function. Patient had no further chest discomfort. He was taken back to the  cath lab on January 20 where he underwent successful PCI and drug-eluting stent placement to the right posterior descending artery. He tolerated this  procedure well and post procedure has been doing without recurrent symptoms or limitations. He'll be discharged home today in good condition.  Discharge Vitals Blood pressure 113/65, pulse 76, temperature 97.7 F (36.5 C), temperature source Oral, resp. rate 16, height 5\' 8"  (1.727 m), weight 198 lb 10.2 oz (90.1 kg), SpO2 95.00%.  Filed Weights   08/22/12 0500 08/23/12 0500 08/24/12 0036  Weight: 197 lb 8.5 oz (89.6 kg) 196 lb 3.4 oz (89 kg) 198 lb 10.2 oz (90.1 kg)   Labs  CBC  Basename 08/24/12 0450 08/23/12 1300  WBC 6.9 6.5  NEUTROABS -- --  HGB 14.4 14.7  HCT 41.7 43.1  MCV 88.5 88.7  PLT 216 217   Basic Metabolic Panel  Basename 08/24/12 0450 08/23/12 1300  NA 138 --  K 4.1 --  CL 105 --  CO2 25 --  GLUCOSE 94 --  BUN 20 --  CREATININE 0.79 0.77  CALCIUM 8.9 --  MG -- --  PHOS -- --   Disposition  Pt is being discharged home today in good condition.  Follow-up Plans & Appointments  Follow-up Information    Follow up with Nicolasa Ducking, NP. On 08/30/2012. (9:00 AM)    Contact information:   [address] [place] [address] [phone]        Discharge Medications    Medication List     As of 08/24/2012 10:41 AM    TAKE these medications         aspirin 81 MG EC tablet   Take 1 tablet (81 mg total) by mouth daily.      atorvastatin 80 MG tablet   Commonly known as: LIPITOR   Take 1 tablet (80 mg total) by mouth daily at 6 PM.      metoprolol tartrate 25 MG tablet   Commonly known as: LOPRESSOR   Take 0.5 tablets (12.5 mg total) by mouth 2 (two) times daily.      nitroGLYCERIN 0.4 MG SL tablet   Commonly known as: NITROSTAT   Place 1 tablet (0.4 mg total) under the tongue every 5 (five) minutes x 3 doses as needed for chest pain.      prasugrel 10 MG Tabs   Commonly known as: EFFIENT   Take 1 tablet (10 mg total) by mouth daily.      Outstanding Labs/Studies  F/U lipids/LFT's in 8 weeks.  Duration of  Discharge Encounter   Greater than 30 minutes including physician time.  Signed, Nicolasa Ducking NP 08/24/2012, 10:41 AM

## 2012-08-30 ENCOUNTER — Encounter: Payer: Self-pay | Admitting: Nurse Practitioner

## 2012-08-30 ENCOUNTER — Ambulatory Visit (INDEPENDENT_AMBULATORY_CARE_PROVIDER_SITE_OTHER): Payer: BC Managed Care – PPO | Admitting: Nurse Practitioner

## 2012-08-30 VITALS — BP 128/76 | HR 70 | Ht 67.5 in | Wt 198.0 lb

## 2012-08-30 DIAGNOSIS — I2510 Atherosclerotic heart disease of native coronary artery without angina pectoris: Secondary | ICD-10-CM

## 2012-08-30 DIAGNOSIS — E785 Hyperlipidemia, unspecified: Secondary | ICD-10-CM

## 2012-08-30 DIAGNOSIS — I2109 ST elevation (STEMI) myocardial infarction involving other coronary artery of anterior wall: Secondary | ICD-10-CM

## 2012-08-30 NOTE — Progress Notes (Signed)
Patient Name: David Pena Date of Encounter: 08/30/2012  Primary Care Provider:  Sissy Hoff, MD Primary Cardiologist:  P. Sw[place]il[place], MD  Patient Profile  57 year old male status post recent anterior myocardial infarction who presents for followup.  Problem List   Past Medical History  Diagnosis Date  . Sleep apnea   . History of DVT of lower extremity     a. RLE following knee surgery in 2005  . Osteoarthritis     a. s/p arthroscopic surgery - R knee  . PONV (postoperative nausea and vomiting)   . Hypertension   . CAD (coronary artery disease)     a. 08/2012 Ant STEMI/Cath/PCI: LM nl, LAD 100 (3.0x33 Xience Expedition DES), LCX 30p, RCA 21m, PDA 90 (staged PCI w/ 2.25x12 Promus DES on 1/20), EF 55%;  b. 08/2012 Echo: 55-60%, mild LVH, no rwma.   Past Surgical History  Procedure Date  . Right knee arthroscopy     a. 2005    Allergies  Allergies  Allergen Reactions  . Dilaudid (Hydromorphone)     Night sweats and terrors    HPI  57 year old male with the above problem list.  He is recently status post anterior myocardial infarction with drug eluting stent placement to the LAD and PDA.  He was discharged home one week ago and presents back today for followup.  Over the past week he has done well without any recurrence of chest pain or dyspnea on exertion.  He has been taking short walks as was recommended by cardiac rehabilitation during his hospitalization.  He has tolerated all his medications well.  He and his wife asked multiple questions-all of which I have answered.  He denies chest pain, palpitations, dyspnea, pnd, orthopnea, n, v, dizziness, syncope, edema, weight gain, or early satiety.  Home Medications  Prior to Admission medications   Medication Sig Start Date End Date Taking? Authorizing Provider  aspirin EC 81 MG EC tablet Take 1 tablet (81 mg total) by mouth daily. 08/24/12  Yes Ok Anis, NP  atorvastatin (LIPITOR) 80 MG tablet Take 1 tablet (80  mg total) by mouth daily at 6 PM. 08/24/12  Yes Ok Anis, NP  metoprolol tartrate (LOPRESSOR) 25 MG tablet Take 0.5 tablets (12.5 mg total) by mouth 2 (two) times daily. 08/24/12  Yes Ok Anis, NP  nitroGLYCERIN (NITROSTAT) 0.4 MG SL tablet Place 1 tablet (0.4 mg total) under the tongue every 5 (five) minutes x 3 doses as needed for chest pain. 08/24/12  Yes Ok Anis, NP  prasugrel (EFFIENT) 10 MG TABS Take 1 tablet (10 mg total) by mouth daily. 08/24/12  Yes Ok Anis, NP    Review of Systems  All other systems reviewed and are otherwise negative except as noted above.  Physical Exam  Blood pressure 128/76, pulse 70, height 5' 7.5" (1.715 m), weight 198 lb (89.812 kg).  General: Pleasant, NAD Psych: Normal affect. Neuro: Alert and oriented X 3. Moves all extremities spontaneously. HEENT: Normal  Neck: Supple without bruits or JVD. Lungs:  Resp regular and unlabored, CTA. Heart: RRR no s3, s4, or murmurs. Abdomen: Soft, non-tender, non-distended, BS + x 4.  Extremities: No clubbing, cyanosis or edema. DP/PT/Radials 2+ and equal bilaterally.  R wrist cath/pci site is mildly ecchymotic w/o bleeding/bruit/hematoma.  Accessory Clinical Findings  ECG - regular sinus rhythm, 70, early repolarization, no acute ST or T changes.  Assessment & Plan  1.  Coronary artery disease/status post anterior myocardial infarction: Patient  status post drug eluding stent placement to the LAD and subsequently the PDA within the past 10 days.  He was discharged 7 days ago and has been doing well without recurrence of dyspnea or chest pain.  He and his wife had many questions including pathophysiology of heart disease, left ventricular function, recovery, medical therapy, and cardiac rehabilitation.  I answered all their questions.  He'll continue on aspirin, statin, beta blocker, and effient therapy.  He does plan to enroll in cardiac rehabilitation.  2.  Hyperlipidemia:   He is on Lipitor 80 mg.  LDL was 126 during hospitalization.  I have arranged for followup lipids and LFTs in 7 weeks.  3.  Disposition: He will followup with Dr. Swaziland in 3-4 months.   Nicolasa Ducking, NP 08/30/2012, 11:53 AM

## 2012-08-30 NOTE — Patient Instructions (Addendum)
Your physician recommends that you schedule a follow-up appointment in: 3-4 months with Dr Swaziland  Your physician recommends that you return for fasting lab work in: 7 weeks  Your physician recommends that you continue on your current medications as directed. Please refer to the Current Medication list given to you today.

## 2012-09-16 ENCOUNTER — Ambulatory Visit (HOSPITAL_COMMUNITY): Payer: BC Managed Care – PPO

## 2012-09-18 ENCOUNTER — Other Ambulatory Visit: Payer: Self-pay

## 2012-09-20 ENCOUNTER — Ambulatory Visit (HOSPITAL_COMMUNITY): Payer: BC Managed Care – PPO

## 2012-09-22 ENCOUNTER — Ambulatory Visit (HOSPITAL_COMMUNITY): Payer: BC Managed Care – PPO

## 2012-09-23 ENCOUNTER — Ambulatory Visit (HOSPITAL_COMMUNITY): Payer: BC Managed Care – PPO

## 2012-09-24 ENCOUNTER — Ambulatory Visit (HOSPITAL_COMMUNITY): Payer: BC Managed Care – PPO

## 2012-09-27 ENCOUNTER — Ambulatory Visit (HOSPITAL_COMMUNITY): Payer: BC Managed Care – PPO

## 2012-09-29 ENCOUNTER — Ambulatory Visit (HOSPITAL_COMMUNITY): Payer: BC Managed Care – PPO

## 2012-09-30 ENCOUNTER — Encounter (HOSPITAL_COMMUNITY)
Admission: RE | Admit: 2012-09-30 | Discharge: 2012-09-30 | Disposition: A | Discharge status: Home / Self Care | Payer: BC Managed Care – PPO | Source: Ambulatory Visit | Attending: Cardiology | Admitting: Cardiology

## 2012-09-30 NOTE — Progress Notes (Signed)
Cardiac Rehab Medication Review by a Pharmacist  Does the patient  feel that his/her medications are working for him/her?  yes  Has the patient been experiencing any side effects to the medications prescribed?  no  Does the patient measure his/her own blood pressure or blood glucose at home?  yes   Does the patient have any problems obtaining medications due to transportation or finances?   no  Understanding of regimen: excellent Understanding of indications: excellent Potential of compliance: excellent  Patient asked great questions about OTCs which were answered to his satisfaction  David Pena 09/30/2012 8:18 AM

## 2012-10-01 ENCOUNTER — Ambulatory Visit (HOSPITAL_COMMUNITY): Payer: BC Managed Care – PPO

## 2012-10-04 ENCOUNTER — Ambulatory Visit (HOSPITAL_COMMUNITY): Payer: BC Managed Care – PPO

## 2012-10-04 ENCOUNTER — Encounter (HOSPITAL_COMMUNITY)
Admission: RE | Admit: 2012-10-04 | Discharge: 2012-10-04 | Disposition: A | Discharge status: Home / Self Care | Payer: BC Managed Care – PPO | Source: Ambulatory Visit | Attending: Cardiology | Admitting: Cardiology

## 2012-10-04 DIAGNOSIS — Z5189 Encounter for other specified aftercare: Secondary | ICD-10-CM | POA: Insufficient documentation

## 2012-10-04 DIAGNOSIS — Z86718 Personal history of other venous thrombosis and embolism: Secondary | ICD-10-CM | POA: Insufficient documentation

## 2012-10-04 DIAGNOSIS — I2109 ST elevation (STEMI) myocardial infarction involving other coronary artery of anterior wall: Secondary | ICD-10-CM | POA: Insufficient documentation

## 2012-10-04 DIAGNOSIS — I2510 Atherosclerotic heart disease of native coronary artery without angina pectoris: Secondary | ICD-10-CM | POA: Insufficient documentation

## 2012-10-04 NOTE — Progress Notes (Signed)
Pt started cardiac rehab today at 1115 exercise class.  Pt tolerated light exercise without difficulty. Monitor showed sr with no ectopy.  Continue to monitor.

## 2012-10-06 ENCOUNTER — Encounter (HOSPITAL_COMMUNITY)
Admission: RE | Admit: 2012-10-06 | Discharge: 2012-10-06 | Disposition: A | Discharge status: Home / Self Care | Payer: BC Managed Care – PPO | Source: Ambulatory Visit | Attending: Cardiology | Admitting: Cardiology

## 2012-10-06 ENCOUNTER — Ambulatory Visit (HOSPITAL_COMMUNITY): Payer: BC Managed Care – PPO

## 2012-10-08 ENCOUNTER — Ambulatory Visit (HOSPITAL_COMMUNITY): Payer: BC Managed Care – PPO

## 2012-10-08 ENCOUNTER — Encounter (HOSPITAL_COMMUNITY): Payer: BC Managed Care – PPO

## 2012-10-11 ENCOUNTER — Encounter (HOSPITAL_COMMUNITY)
Admission: RE | Admit: 2012-10-11 | Discharge: 2012-10-11 | Disposition: A | Discharge status: Home / Self Care | Payer: BC Managed Care – PPO | Source: Ambulatory Visit | Attending: Cardiology | Admitting: Cardiology

## 2012-10-11 ENCOUNTER — Ambulatory Visit (HOSPITAL_COMMUNITY): Payer: BC Managed Care – PPO

## 2012-10-13 ENCOUNTER — Encounter (HOSPITAL_COMMUNITY)
Admission: RE | Admit: 2012-10-13 | Discharge: 2012-10-13 | Disposition: A | Discharge status: Home / Self Care | Payer: BC Managed Care – PPO | Source: Ambulatory Visit | Attending: Cardiology | Admitting: Cardiology

## 2012-10-13 ENCOUNTER — Ambulatory Visit (HOSPITAL_COMMUNITY): Payer: BC Managed Care – PPO

## 2012-10-15 ENCOUNTER — Ambulatory Visit (HOSPITAL_COMMUNITY): Payer: BC Managed Care – PPO

## 2012-10-15 ENCOUNTER — Encounter (HOSPITAL_COMMUNITY): Payer: BC Managed Care – PPO

## 2012-10-18 ENCOUNTER — Other Ambulatory Visit (INDEPENDENT_AMBULATORY_CARE_PROVIDER_SITE_OTHER): Payer: BC Managed Care – PPO

## 2012-10-18 ENCOUNTER — Ambulatory Visit (HOSPITAL_COMMUNITY): Payer: BC Managed Care – PPO

## 2012-10-18 ENCOUNTER — Encounter (HOSPITAL_COMMUNITY)
Admission: RE | Admit: 2012-10-18 | Discharge: 2012-10-18 | Disposition: A | Discharge status: Home / Self Care | Payer: BC Managed Care – PPO | Source: Ambulatory Visit | Attending: Cardiology | Admitting: Cardiology

## 2012-10-18 LAB — HEPATIC FUNCTION PANEL
ALT: 33 U/L (ref 0–53)
AST: 22 U/L (ref 0–37)
Albumin: 4.3 g/dL (ref 3.5–5.2)

## 2012-10-18 LAB — LIPID PANEL: Cholesterol: 79 mg/dL (ref 0–200)

## 2012-10-18 NOTE — Progress Notes (Signed)
[id number] Reviewed home exercise guidelines with patient including endpoints, temperature precautions, target heart rate and rate of perceived exertion. Pt has access to recumbent bike and elliptical machine at his church, and also plans to walk as his mode of home exercise. Pt voices understanding of instructions given.  Cristy Hilts, MS, ACSM CES

## 2012-10-20 ENCOUNTER — Encounter (HOSPITAL_COMMUNITY)
Admission: RE | Admit: 2012-10-20 | Discharge: 2012-10-20 | Disposition: A | Discharge status: Home / Self Care | Payer: BC Managed Care – PPO | Source: Ambulatory Visit | Attending: Cardiology | Admitting: Cardiology

## 2012-10-20 ENCOUNTER — Ambulatory Visit (HOSPITAL_COMMUNITY): Payer: BC Managed Care – PPO

## 2012-10-22 ENCOUNTER — Encounter (HOSPITAL_COMMUNITY)
Admission: RE | Admit: 2012-10-22 | Discharge: 2012-10-22 | Disposition: A | Discharge status: Home / Self Care | Payer: BC Managed Care – PPO | Source: Ambulatory Visit | Attending: Cardiology | Admitting: Cardiology

## 2012-10-22 ENCOUNTER — Ambulatory Visit (HOSPITAL_COMMUNITY): Payer: BC Managed Care – PPO

## 2012-10-22 NOTE — Progress Notes (Signed)
Pt to switch to 8:15 exercise class due to work schedule.

## 2012-10-23 ENCOUNTER — Encounter: Payer: Self-pay | Admitting: Nurse Practitioner

## 2012-10-25 ENCOUNTER — Encounter (HOSPITAL_COMMUNITY): Payer: BC Managed Care – PPO

## 2012-10-25 ENCOUNTER — Encounter (HOSPITAL_COMMUNITY)
Admission: RE | Admit: 2012-10-25 | Discharge: 2012-10-25 | Disposition: A | Discharge status: Home / Self Care | Payer: BC Managed Care – PPO | Source: Ambulatory Visit | Attending: Cardiology | Admitting: Cardiology

## 2012-10-25 ENCOUNTER — Ambulatory Visit (HOSPITAL_COMMUNITY): Payer: BC Managed Care – PPO

## 2012-10-25 NOTE — Progress Notes (Signed)
David Pena 57 y.o. male Nutrition Note Spoke with pt.  Nutrition Plan and Nutrition Survey goals reviewed with pt. Pt is following Step 2 of the Therapeutic Lifestyle Changes diet. Pt wants to lose wt. Pt wt 84.1 kg (185.0 lb). Pt wt is down 2.9 kg (6.4 lb) over the past 3 weeks. Rate of wt loss appears safe. Pt has been trying to lose wt by eating healthier ("no white or fried foods) and exercising. Wt loss tips reviewed.  Pt is aware of dx of pre-diabetes. Pre-diabetes discussed. Pt expressed understanding of the information reviewed. Pt aware of nutrition education classes offered and is unable to attend nutrition classes.  Nutrition Diagnosis   Food-and nutrition-related knowledge deficit related to lack of exposure to information as related to diagnosis of: ? CVD ? Pre-DM (A1c 6.3) ?   Overweight related to excessive energy intake as evidenced by a BMI of 29.6  Nutrition RX/ Estimated Daily Nutrition Needs for: wt loss  1500-2000 Kcal, 40-55 gm fat, 10-13 gm sat fat, 1.5-2.0 gm trans-fat, <1500 mg sodium   Nutrition Intervention   Pt's individual nutrition plan reviewed with pt.   Benefits of adopting Therapeutic Lifestyle Changes discussed when Medficts reviewed.   Pt to attend the Portion Distortion class - met 10/06/12   Pt given handouts for: ? Nutrition I class ? Nutrition II class    Continue client-centered nutrition education by RD, as part of interdisciplinary care. Goal(s)   Pt to identify food quantities necessary to achieve: ? wt loss to a goal wt of 168-180 lb (76.1-81.5 kg) at graduation from cardiac rehab.  Monitor and Evaluate progress toward nutrition goal with team. Nutrition Risk: Change to Moderate   Mickle Plumb, M.Ed, RD, LDN, CDE 10/25/2012 9:15 AM

## 2012-10-27 ENCOUNTER — Ambulatory Visit (HOSPITAL_COMMUNITY): Payer: BC Managed Care – PPO

## 2012-10-27 ENCOUNTER — Encounter (HOSPITAL_COMMUNITY): Payer: BC Managed Care – PPO

## 2012-10-27 ENCOUNTER — Encounter (HOSPITAL_COMMUNITY)
Admission: RE | Admit: 2012-10-27 | Discharge: 2012-10-27 | Disposition: A | Discharge status: Home / Self Care | Payer: BC Managed Care – PPO | Source: Ambulatory Visit | Attending: Cardiology | Admitting: Cardiology

## 2012-10-29 ENCOUNTER — Ambulatory Visit (HOSPITAL_COMMUNITY): Payer: BC Managed Care – PPO

## 2012-10-29 ENCOUNTER — Encounter (HOSPITAL_COMMUNITY): Payer: BC Managed Care – PPO

## 2012-11-01 ENCOUNTER — Encounter (HOSPITAL_COMMUNITY): Payer: BC Managed Care – PPO

## 2012-11-01 ENCOUNTER — Ambulatory Visit (HOSPITAL_COMMUNITY): Payer: BC Managed Care – PPO

## 2012-11-01 ENCOUNTER — Encounter (HOSPITAL_COMMUNITY)
Admission: RE | Admit: 2012-11-01 | Discharge: 2012-11-01 | Disposition: A | Discharge status: Home / Self Care | Payer: BC Managed Care – PPO | Source: Ambulatory Visit | Attending: Cardiology | Admitting: Cardiology

## 2012-11-03 ENCOUNTER — Ambulatory Visit (HOSPITAL_COMMUNITY): Payer: BC Managed Care – PPO

## 2012-11-03 ENCOUNTER — Encounter (HOSPITAL_COMMUNITY): Payer: BC Managed Care – PPO

## 2012-11-03 ENCOUNTER — Encounter (HOSPITAL_COMMUNITY)
Admission: RE | Admit: 2012-11-03 | Discharge: 2012-11-03 | Disposition: A | Discharge status: Home / Self Care | Payer: BC Managed Care – PPO | Source: Ambulatory Visit | Attending: Cardiology | Admitting: Cardiology

## 2012-11-03 DIAGNOSIS — I2109 ST elevation (STEMI) myocardial infarction involving other coronary artery of anterior wall: Secondary | ICD-10-CM | POA: Insufficient documentation

## 2012-11-03 DIAGNOSIS — Z86718 Personal history of other venous thrombosis and embolism: Secondary | ICD-10-CM | POA: Insufficient documentation

## 2012-11-03 DIAGNOSIS — I2510 Atherosclerotic heart disease of native coronary artery without angina pectoris: Secondary | ICD-10-CM | POA: Insufficient documentation

## 2012-11-03 DIAGNOSIS — Z5189 Encounter for other specified aftercare: Secondary | ICD-10-CM | POA: Insufficient documentation

## 2012-11-05 ENCOUNTER — Ambulatory Visit (HOSPITAL_COMMUNITY): Payer: BC Managed Care – PPO

## 2012-11-05 ENCOUNTER — Encounter (HOSPITAL_COMMUNITY)
Admission: RE | Admit: 2012-11-05 | Discharge: 2012-11-05 | Disposition: A | Discharge status: Home / Self Care | Payer: BC Managed Care – PPO | Source: Ambulatory Visit | Attending: Cardiology | Admitting: Cardiology

## 2012-11-05 ENCOUNTER — Encounter (HOSPITAL_COMMUNITY): Payer: BC Managed Care – PPO

## 2012-11-08 ENCOUNTER — Encounter (HOSPITAL_COMMUNITY)
Admission: RE | Admit: 2012-11-08 | Discharge: 2012-11-08 | Disposition: A | Discharge status: Home / Self Care | Payer: BC Managed Care – PPO | Source: Ambulatory Visit | Attending: Cardiology | Admitting: Cardiology

## 2012-11-08 ENCOUNTER — Encounter (HOSPITAL_COMMUNITY): Payer: BC Managed Care – PPO

## 2012-11-08 ENCOUNTER — Ambulatory Visit (HOSPITAL_COMMUNITY): Payer: BC Managed Care – PPO

## 2012-11-10 ENCOUNTER — Encounter (HOSPITAL_COMMUNITY)
Admission: RE | Admit: 2012-11-10 | Discharge: 2012-11-10 | Disposition: A | Discharge status: Home / Self Care | Payer: BC Managed Care – PPO | Source: Ambulatory Visit | Attending: Cardiology | Admitting: Cardiology

## 2012-11-10 ENCOUNTER — Encounter (HOSPITAL_COMMUNITY): Payer: BC Managed Care – PPO

## 2012-11-10 ENCOUNTER — Ambulatory Visit (HOSPITAL_COMMUNITY): Payer: BC Managed Care – PPO

## 2012-11-12 ENCOUNTER — Ambulatory Visit (HOSPITAL_COMMUNITY): Payer: BC Managed Care – PPO

## 2012-11-12 ENCOUNTER — Encounter (HOSPITAL_COMMUNITY): Payer: BC Managed Care – PPO

## 2012-11-12 ENCOUNTER — Telehealth (HOSPITAL_COMMUNITY): Payer: Self-pay | Admitting: Cardiac Rehabilitation

## 2012-11-12 NOTE — Telephone Encounter (Signed)
Pt called cardiac rehab to report he will be absent 11/12/12 and 11/15/12

## 2012-11-15 ENCOUNTER — Encounter (HOSPITAL_COMMUNITY): Payer: BC Managed Care – PPO

## 2012-11-15 ENCOUNTER — Ambulatory Visit (HOSPITAL_COMMUNITY): Payer: BC Managed Care – PPO

## 2012-11-17 ENCOUNTER — Encounter (HOSPITAL_COMMUNITY): Payer: BC Managed Care – PPO

## 2012-11-17 ENCOUNTER — Ambulatory Visit (HOSPITAL_COMMUNITY): Payer: BC Managed Care – PPO

## 2012-11-17 ENCOUNTER — Encounter (HOSPITAL_COMMUNITY)
Admission: RE | Admit: 2012-11-17 | Discharge: 2012-11-17 | Disposition: A | Discharge status: Home / Self Care | Payer: BC Managed Care – PPO | Source: Ambulatory Visit | Attending: Cardiology | Admitting: Cardiology

## 2012-11-19 ENCOUNTER — Ambulatory Visit (HOSPITAL_COMMUNITY): Payer: BC Managed Care – PPO

## 2012-11-19 ENCOUNTER — Encounter (HOSPITAL_COMMUNITY): Payer: BC Managed Care – PPO

## 2012-11-19 ENCOUNTER — Encounter (HOSPITAL_COMMUNITY)
Admission: RE | Admit: 2012-11-19 | Discharge: 2012-11-19 | Disposition: A | Discharge status: Home / Self Care | Payer: BC Managed Care – PPO | Source: Ambulatory Visit | Attending: Cardiology | Admitting: Cardiology

## 2012-11-22 ENCOUNTER — Ambulatory Visit (HOSPITAL_COMMUNITY): Payer: BC Managed Care – PPO

## 2012-11-22 ENCOUNTER — Encounter (HOSPITAL_COMMUNITY): Payer: BC Managed Care – PPO

## 2012-11-22 ENCOUNTER — Encounter (HOSPITAL_COMMUNITY)
Admission: RE | Admit: 2012-11-22 | Discharge: 2012-11-22 | Disposition: A | Discharge status: Home / Self Care | Payer: BC Managed Care – PPO | Source: Ambulatory Visit | Attending: Cardiology | Admitting: Cardiology

## 2012-11-24 ENCOUNTER — Encounter (HOSPITAL_COMMUNITY): Payer: BC Managed Care – PPO

## 2012-11-24 ENCOUNTER — Ambulatory Visit (HOSPITAL_COMMUNITY): Payer: BC Managed Care – PPO

## 2012-11-24 ENCOUNTER — Encounter (HOSPITAL_COMMUNITY)
Admission: RE | Admit: 2012-11-24 | Discharge: 2012-11-24 | Disposition: A | Discharge status: Home / Self Care | Payer: BC Managed Care – PPO | Source: Ambulatory Visit | Attending: Cardiology | Admitting: Cardiology

## 2012-11-26 ENCOUNTER — Encounter (HOSPITAL_COMMUNITY): Payer: BC Managed Care – PPO

## 2012-11-26 ENCOUNTER — Ambulatory Visit (HOSPITAL_COMMUNITY): Payer: BC Managed Care – PPO

## 2012-11-29 ENCOUNTER — Ambulatory Visit (HOSPITAL_COMMUNITY): Payer: BC Managed Care – PPO

## 2012-11-29 ENCOUNTER — Encounter (HOSPITAL_COMMUNITY): Payer: BC Managed Care – PPO

## 2012-11-29 ENCOUNTER — Encounter (HOSPITAL_COMMUNITY)
Admission: RE | Admit: 2012-11-29 | Discharge: 2012-11-29 | Disposition: A | Discharge status: Home / Self Care | Payer: BC Managed Care – PPO | Source: Ambulatory Visit | Attending: Cardiology | Admitting: Cardiology

## 2012-12-01 ENCOUNTER — Ambulatory Visit (HOSPITAL_COMMUNITY): Payer: BC Managed Care – PPO

## 2012-12-01 ENCOUNTER — Encounter (HOSPITAL_COMMUNITY): Payer: BC Managed Care – PPO

## 2012-12-01 ENCOUNTER — Encounter (HOSPITAL_COMMUNITY)
Admission: RE | Admit: 2012-12-01 | Discharge: 2012-12-01 | Disposition: A | Discharge status: Home / Self Care | Payer: BC Managed Care – PPO | Source: Ambulatory Visit | Attending: Cardiology | Admitting: Cardiology

## 2012-12-02 ENCOUNTER — Ambulatory Visit (INDEPENDENT_AMBULATORY_CARE_PROVIDER_SITE_OTHER): Payer: BC Managed Care – PPO | Admitting: Cardiology

## 2012-12-02 ENCOUNTER — Encounter: Payer: Self-pay | Admitting: Cardiology

## 2012-12-02 VITALS — BP 120/82 | HR 64 | Ht 67.5 in | Wt 184.4 lb

## 2012-12-02 DIAGNOSIS — E785 Hyperlipidemia, unspecified: Secondary | ICD-10-CM

## 2012-12-02 DIAGNOSIS — I2510 Atherosclerotic heart disease of native coronary artery without angina pectoris: Secondary | ICD-10-CM

## 2012-12-02 NOTE — Progress Notes (Signed)
David Pena Date of Birth: 06-23-1956 Medical Record #[id number]  History of Present Illness: David Pena is seen today for followup. He presented in January 2014 with an anterior STEMI. He underwent emergent stenting of the proximal LAD with a 3.0 x 33 mm Xience stent. He later had a staged PCI of the PDA with a 2.25 x 12 mm Promus stent. His ejection fraction was normal. He continues to participate in cardiac rehabilitation. He is feeling very well. He denies any recurrent chest pain, shortness of breath, or palpitations. He has followed a very healthy diet and has lost 14 pounds.  Current Outpatient Prescriptions on File Prior to Visit  Medication Sig Dispense Refill  . aspirin EC 81 MG EC tablet Take 1 tablet (81 mg total) by mouth daily.      Marland Kitchen atorvastatin (LIPITOR) 80 MG tablet Take 1 tablet (80 mg total) by mouth daily at 6 PM.  30 tablet  6  . metoprolol tartrate (LOPRESSOR) 25 MG tablet Take 0.5 tablets (12.5 mg total) by mouth 2 (two) times daily.  30 tablet  6  . nitroGLYCERIN (NITROSTAT) 0.4 MG SL tablet Place 1 tablet (0.4 mg total) under the tongue every 5 (five) minutes x 3 doses as needed for chest pain.  25 tablet  3  . prasugrel (EFFIENT) 10 MG TABS Take 1 tablet (10 mg total) by mouth daily.  30 tablet  6   No current facility-administered medications on file prior to visit.    Allergies  Allergen Reactions  . Dilaudid (Hydromorphone)     Night sweats and terrors    Past Medical History  Diagnosis Date  . Sleep apnea   . History of DVT of lower extremity     a. RLE following knee surgery in 2005  . Osteoarthritis     a. s/p arthroscopic surgery - R knee  . PONV (postoperative nausea and vomiting)   . Hypertension   . CAD (coronary artery disease)     a. 08/2012 Ant STEMI/Cath/PCI: LM nl, LAD 100 (3.0x33 Xience Expedition DES), LCX 30p, RCA 65m, PDA 90 (staged PCI w/ 2.25x12 Promus DES on 1/20), EF 55%;  b. 08/2012 Echo: 55-60%, mild LVH, no rwma.    Past  Surgical History  Procedure Laterality Date  . Right knee arthroscopy      a. 2005    History  Smoking status  . Never Smoker   Smokeless tobacco  . Not on file    History  Alcohol Use No    Family History  Problem Relation Age of Onset  . Heart failure Father     died in his 18's.  . High Cholesterol Mother     alive @ 7.    Review of Systems: The review of systems is positive for relatively low blood pressure readings at rehabilitation. The patient is asymptomatic. All other systems were reviewed and are negative.  Physical Exam: BP 120/82  Pulse 64  Ht 5' 7.5" (1.715 m)  Wt 184 lb 6 oz (83.632 kg)  BMI 28.43 kg/m2 General: Pleasant white male in no acute distress. HEENT: Normal Neck: No adenopathy or JVD. No bruits. No thyromegaly. Lungs: Clear Cardiovascular: Regular rate and rhythm. Normal S1 and S2 without gallop or murmur. Abdomen: Soft and nontender. Extremities: No edema. Pulses are 2+ and symmetric. Skin: Warm and dry. Neuro: Alert and oriented x3. Cranial nerves II through XII are intact. LABORATORY DATA: Lab Results  Component Value Date   WBC 6.9 08/24/2012  HGB 14.4 08/24/2012   HCT 41.7 08/24/2012   PLT 216 08/24/2012   GLUCOSE 94 08/24/2012   CHOL 79 10/18/2012   TRIG 53.0 10/18/2012   HDL 25.30* 10/18/2012   LDLCALC 43 10/18/2012   ALT 33 10/18/2012   AST 22 10/18/2012   NA 138 08/24/2012   K 4.1 08/24/2012   CL 105 08/24/2012   CREATININE 0.79 08/24/2012   BUN 20 08/24/2012   CO2 25 08/24/2012   TSH 0.742 08/20/2012   INR 1.21 08/20/2012   HGBA1C 6.3* 08/20/2012     Assessment / Plan: 1. Coronary disease status post anterior STEMI in January 2014 treated with DES of the proximal LAD. Subsequent DES of the PDA. Continue dual antiplatelet therapy for one year. Continue low-dose beta blocker and statin therapy. Of note the patient was asymptomatic prior to his event. He is anxious to have some followup assessment. I think it would be reasonable to do  a routine stress test at one year unless he has new symptoms.  2. Hyperlipidemia, well controlled on high dose Lipitor. Still has a low HDL. Encouraged continued dietary and exercise modification.

## 2012-12-02 NOTE — Patient Instructions (Signed)
Continue your current therapy  I will see you in 6 months.   

## 2012-12-03 ENCOUNTER — Encounter (HOSPITAL_COMMUNITY): Admission: RE | Admit: 2012-12-03 | Payer: BC Managed Care – PPO | Source: Ambulatory Visit

## 2012-12-03 ENCOUNTER — Ambulatory Visit (HOSPITAL_COMMUNITY): Payer: BC Managed Care – PPO

## 2012-12-03 ENCOUNTER — Encounter (HOSPITAL_COMMUNITY): Payer: BC Managed Care – PPO

## 2012-12-06 ENCOUNTER — Ambulatory Visit (HOSPITAL_COMMUNITY): Payer: BC Managed Care – PPO

## 2012-12-06 ENCOUNTER — Encounter (HOSPITAL_COMMUNITY): Payer: BC Managed Care – PPO

## 2012-12-06 ENCOUNTER — Encounter (HOSPITAL_COMMUNITY)
Admission: RE | Admit: 2012-12-06 | Discharge: 2012-12-06 | Disposition: A | Discharge status: Home / Self Care | Payer: BC Managed Care – PPO | Source: Ambulatory Visit | Attending: Cardiology | Admitting: Cardiology

## 2012-12-06 DIAGNOSIS — I2109 ST elevation (STEMI) myocardial infarction involving other coronary artery of anterior wall: Secondary | ICD-10-CM | POA: Insufficient documentation

## 2012-12-06 DIAGNOSIS — Z86718 Personal history of other venous thrombosis and embolism: Secondary | ICD-10-CM | POA: Insufficient documentation

## 2012-12-06 DIAGNOSIS — Z5189 Encounter for other specified aftercare: Secondary | ICD-10-CM | POA: Insufficient documentation

## 2012-12-06 DIAGNOSIS — I2510 Atherosclerotic heart disease of native coronary artery without angina pectoris: Secondary | ICD-10-CM | POA: Insufficient documentation

## 2012-12-08 ENCOUNTER — Encounter (HOSPITAL_COMMUNITY): Payer: BC Managed Care – PPO

## 2012-12-08 ENCOUNTER — Ambulatory Visit (HOSPITAL_COMMUNITY): Payer: BC Managed Care – PPO

## 2012-12-10 ENCOUNTER — Ambulatory Visit (HOSPITAL_COMMUNITY): Payer: BC Managed Care – PPO

## 2012-12-10 ENCOUNTER — Encounter (HOSPITAL_COMMUNITY): Payer: BC Managed Care – PPO

## 2012-12-13 ENCOUNTER — Telehealth (HOSPITAL_COMMUNITY): Payer: Self-pay | Admitting: Cardiac Rehabilitation

## 2012-12-13 ENCOUNTER — Encounter (HOSPITAL_COMMUNITY)
Admission: RE | Admit: 2012-12-13 | Discharge: 2012-12-13 | Disposition: A | Discharge status: Home / Self Care | Payer: BC Managed Care – PPO | Source: Ambulatory Visit | Attending: Cardiology | Admitting: Cardiology

## 2012-12-13 ENCOUNTER — Ambulatory Visit (HOSPITAL_COMMUNITY): Payer: BC Managed Care – PPO

## 2012-12-13 ENCOUNTER — Encounter (HOSPITAL_COMMUNITY): Payer: BC Managed Care – PPO

## 2012-12-13 NOTE — Telephone Encounter (Signed)
Message received from pt will be absent from cardiac rehab for work conflicts

## 2012-12-15 ENCOUNTER — Encounter (HOSPITAL_COMMUNITY)
Admission: RE | Admit: 2012-12-15 | Discharge: 2012-12-15 | Disposition: A | Discharge status: Home / Self Care | Payer: BC Managed Care – PPO | Source: Ambulatory Visit | Attending: Cardiology | Admitting: Cardiology

## 2012-12-15 ENCOUNTER — Ambulatory Visit (HOSPITAL_COMMUNITY): Payer: BC Managed Care – PPO

## 2012-12-15 ENCOUNTER — Encounter (HOSPITAL_COMMUNITY): Payer: BC Managed Care – PPO

## 2012-12-17 ENCOUNTER — Encounter (HOSPITAL_COMMUNITY): Payer: BC Managed Care – PPO

## 2012-12-17 ENCOUNTER — Ambulatory Visit (HOSPITAL_COMMUNITY): Payer: BC Managed Care – PPO

## 2012-12-17 ENCOUNTER — Encounter (HOSPITAL_COMMUNITY)
Admission: RE | Admit: 2012-12-17 | Discharge: 2012-12-17 | Disposition: A | Discharge status: Home / Self Care | Payer: BC Managed Care – PPO | Source: Ambulatory Visit | Attending: Cardiology | Admitting: Cardiology

## 2012-12-20 ENCOUNTER — Encounter (HOSPITAL_COMMUNITY): Payer: BC Managed Care – PPO

## 2012-12-20 ENCOUNTER — Ambulatory Visit (HOSPITAL_COMMUNITY): Payer: BC Managed Care – PPO

## 2012-12-20 ENCOUNTER — Encounter (HOSPITAL_COMMUNITY)
Admission: RE | Admit: 2012-12-20 | Discharge: 2012-12-20 | Disposition: A | Discharge status: Home / Self Care | Payer: BC Managed Care – PPO | Source: Ambulatory Visit | Attending: Cardiology | Admitting: Cardiology

## 2012-12-22 ENCOUNTER — Ambulatory Visit (HOSPITAL_COMMUNITY): Payer: BC Managed Care – PPO

## 2012-12-22 ENCOUNTER — Encounter (HOSPITAL_COMMUNITY)
Admission: RE | Admit: 2012-12-22 | Discharge: 2012-12-22 | Disposition: A | Discharge status: Home / Self Care | Payer: BC Managed Care – PPO | Source: Ambulatory Visit | Attending: Cardiology | Admitting: Cardiology

## 2012-12-22 ENCOUNTER — Encounter (HOSPITAL_COMMUNITY): Payer: BC Managed Care – PPO

## 2012-12-24 ENCOUNTER — Ambulatory Visit (HOSPITAL_COMMUNITY): Payer: BC Managed Care – PPO

## 2012-12-24 ENCOUNTER — Encounter (HOSPITAL_COMMUNITY): Payer: BC Managed Care – PPO

## 2012-12-27 ENCOUNTER — Encounter (HOSPITAL_COMMUNITY): Payer: BC Managed Care – PPO

## 2012-12-29 ENCOUNTER — Encounter (HOSPITAL_COMMUNITY)
Admission: RE | Admit: 2012-12-29 | Discharge: 2012-12-29 | Disposition: A | Discharge status: Home / Self Care | Payer: BC Managed Care – PPO | Source: Ambulatory Visit | Attending: Cardiology | Admitting: Cardiology

## 2012-12-29 ENCOUNTER — Encounter (HOSPITAL_COMMUNITY): Payer: BC Managed Care – PPO

## 2012-12-31 ENCOUNTER — Encounter (HOSPITAL_COMMUNITY): Payer: BC Managed Care – PPO

## 2013-01-03 ENCOUNTER — Encounter (HOSPITAL_COMMUNITY)
Admission: RE | Admit: 2013-01-03 | Discharge: 2013-01-03 | Disposition: A | Discharge status: Home / Self Care | Payer: BC Managed Care – PPO | Source: Ambulatory Visit | Attending: Cardiology | Admitting: Cardiology

## 2013-01-03 ENCOUNTER — Encounter (HOSPITAL_COMMUNITY): Payer: BC Managed Care – PPO

## 2013-01-03 DIAGNOSIS — Z5189 Encounter for other specified aftercare: Secondary | ICD-10-CM | POA: Insufficient documentation

## 2013-01-03 DIAGNOSIS — I2510 Atherosclerotic heart disease of native coronary artery without angina pectoris: Secondary | ICD-10-CM | POA: Insufficient documentation

## 2013-01-03 DIAGNOSIS — I2109 ST elevation (STEMI) myocardial infarction involving other coronary artery of anterior wall: Secondary | ICD-10-CM | POA: Insufficient documentation

## 2013-01-03 DIAGNOSIS — Z86718 Personal history of other venous thrombosis and embolism: Secondary | ICD-10-CM | POA: Insufficient documentation

## 2013-01-05 ENCOUNTER — Encounter (HOSPITAL_COMMUNITY)
Admission: RE | Admit: 2013-01-05 | Discharge: 2013-01-05 | Disposition: A | Discharge status: Home / Self Care | Payer: BC Managed Care – PPO | Source: Ambulatory Visit | Attending: Cardiology | Admitting: Cardiology

## 2013-01-05 ENCOUNTER — Encounter (HOSPITAL_COMMUNITY): Payer: BC Managed Care – PPO

## 2013-01-07 ENCOUNTER — Encounter (HOSPITAL_COMMUNITY): Payer: BC Managed Care – PPO

## 2013-01-07 ENCOUNTER — Encounter (HOSPITAL_COMMUNITY)
Admission: RE | Admit: 2013-01-07 | Discharge: 2013-01-07 | Disposition: A | Discharge status: Home / Self Care | Payer: BC Managed Care – PPO | Source: Ambulatory Visit | Attending: Cardiology | Admitting: Cardiology

## 2013-01-17 ENCOUNTER — Encounter (HOSPITAL_COMMUNITY)
Admission: RE | Admit: 2013-01-17 | Discharge: 2013-01-17 | Disposition: A | Discharge status: Home / Self Care | Payer: BC Managed Care – PPO | Source: Ambulatory Visit | Attending: Cardiology | Admitting: Cardiology

## 2013-01-19 ENCOUNTER — Encounter (HOSPITAL_COMMUNITY)
Admission: RE | Admit: 2013-01-19 | Discharge: 2013-01-19 | Disposition: A | Discharge status: Home / Self Care | Payer: BC Managed Care – PPO | Source: Ambulatory Visit | Attending: Cardiology | Admitting: Cardiology

## 2013-01-21 ENCOUNTER — Encounter (HOSPITAL_COMMUNITY)
Admission: RE | Admit: 2013-01-21 | Discharge: 2013-01-21 | Disposition: A | Discharge status: Home / Self Care | Payer: BC Managed Care – PPO | Source: Ambulatory Visit | Attending: Cardiology | Admitting: Cardiology

## 2013-01-24 ENCOUNTER — Encounter (HOSPITAL_COMMUNITY)
Admission: RE | Admit: 2013-01-24 | Discharge: 2013-01-24 | Disposition: A | Discharge status: Home / Self Care | Payer: BC Managed Care – PPO | Source: Ambulatory Visit | Attending: Cardiology | Admitting: Cardiology

## 2013-01-26 ENCOUNTER — Encounter (HOSPITAL_COMMUNITY)
Admission: RE | Admit: 2013-01-26 | Discharge: 2013-01-26 | Disposition: A | Discharge status: Home / Self Care | Payer: BC Managed Care – PPO | Source: Ambulatory Visit | Attending: Cardiology | Admitting: Cardiology

## 2013-01-28 ENCOUNTER — Encounter (HOSPITAL_COMMUNITY): Payer: BC Managed Care – PPO

## 2013-01-28 ENCOUNTER — Telehealth (HOSPITAL_COMMUNITY): Payer: Self-pay | Admitting: Family Medicine

## 2013-03-14 ENCOUNTER — Other Ambulatory Visit: Payer: Self-pay | Admitting: Nurse Practitioner

## 2013-03-24 ENCOUNTER — Encounter: Payer: Self-pay | Admitting: Nurse Practitioner

## 2013-03-29 NOTE — Telephone Encounter (Signed)
Patient called 03/25/13 Dr.Jordan advised to ride moderate rides.Do not ride rides with a warning to heart patients.

## 2013-05-08 ENCOUNTER — Telehealth: Payer: Self-pay | Admitting: Adult Health

## 2013-05-08 NOTE — Telephone Encounter (Signed)
Mr.Deller called after experiencing chills and diaphoresis after dental procedure 3 days ago, where he was provided with prophylactic antibiotics during teeth cleaning. This was given to him by his dentist with his hx of stents in January of 2014.    The cleaning was on Thursday 05/04/2013 and the symptoms began 3 days later on Sat. He stated that is occurred throughout the night without chest pain or dyspnea. Will no recurrence since that time. He was afebrile.   He has an appt with Dr. Swaziland on Thursday of this week 05/12/2013. I have asked him to keep that appt. He is to call for further recurrence of symptoms at which time CBC and BMET will be ordered.

## 2013-05-12 ENCOUNTER — Encounter: Payer: Self-pay | Admitting: Cardiology

## 2013-05-12 ENCOUNTER — Ambulatory Visit (INDEPENDENT_AMBULATORY_CARE_PROVIDER_SITE_OTHER): Payer: BC Managed Care – PPO | Admitting: Cardiology

## 2013-05-12 VITALS — BP 121/72 | HR 76 | Ht 68.0 in | Wt 183.0 lb

## 2013-05-12 DIAGNOSIS — E785 Hyperlipidemia, unspecified: Secondary | ICD-10-CM

## 2013-05-12 DIAGNOSIS — I2510 Atherosclerotic heart disease of native coronary artery without angina pectoris: Secondary | ICD-10-CM

## 2013-05-12 NOTE — Patient Instructions (Addendum)
You need to increase your aerobic exercise  Continue your current therapy. You may stop Effient in January  I will see you in 6 months with fasting lab work

## 2013-05-12 NOTE — Progress Notes (Signed)
David Pena Date of Birth: 1956-03-16 Medical Record #[id number]  History of Present Illness: David Pena is seen today for followup. He presented in January 2014 with an anterior STEMI. He underwent emergent stenting of the proximal LAD with a 3.0 x 33 mm Xience stent. He later had a staged PCI of the PDA with a 2.25 x 12 mm Promus stent. His ejection fraction was normal. On followup today he states that he feels more fatigued. He has not been doing his exercise over the past 2 months. He has been doing well with his diet. He is back working extra hours. His wife expresses concern about him slipping back into old habits.  Current Outpatient Prescriptions on File Prior to Visit  Medication Sig Dispense Refill  . aspirin EC 81 MG EC tablet Take 1 tablet (81 mg total) by mouth daily.      Marland Kitchen atorvastatin (LIPITOR) 80 MG tablet TAKE 1 TABLET (80 MG TOTAL) BY MOUTH DAILY AT 6 PM.  30 tablet  6  . EFFIENT 10 MG TABS tablet TAKE 1 TABLET (10 MG TOTAL) BY MOUTH DAILY.  30 each  6  . metoprolol tartrate (LOPRESSOR) 25 MG tablet TAKE 1/2 TABLET BY MOUTH TWICE DAILY.  30 tablet  6  . nitroGLYCERIN (NITROSTAT) 0.4 MG SL tablet Place 1 tablet (0.4 mg total) under the tongue every 5 (five) minutes x 3 doses as needed for chest pain.  25 tablet  3   No current facility-administered medications on file prior to visit.    Allergies  Allergen Reactions  . Dilaudid [Hydromorphone]     Night sweats and terrors    Past Medical History  Diagnosis Date  . Sleep apnea   . History of DVT of lower extremity     a. RLE following knee surgery in 2005  . Osteoarthritis     a. s/p arthroscopic surgery - R knee  . PONV (postoperative nausea and vomiting)   . Hypertension   . CAD (coronary artery disease)     a. 08/2012 Ant STEMI/Cath/PCI: LM nl, LAD 100 (3.0x33 Xience Expedition DES), LCX 30p, RCA 48m, PDA 90 (staged PCI w/ 2.25x12 Promus DES on 1/20), EF 55%;  b. 08/2012 Echo: 55-60%, mild LVH, no rwma.     Past Surgical History  Procedure Laterality Date  . Right knee arthroscopy      a. 2005    History  Smoking status  . Never Smoker   Smokeless tobacco  . Not on file    History  Alcohol Use No    Family History  Problem Relation Age of Onset  . Heart failure Father     died in his 17's.  . High Cholesterol Mother     alive @ 76.    Review of Systems: The review of systems is positive for relatively low blood pressure readings at rehabilitation. The patient is asymptomatic. All other systems were reviewed and are negative.  Physical Exam: BP 121/72  Pulse 76  Ht 5\' 8"  (1.727 m)  Wt 183 lb (83.008 kg)  BMI 27.83 kg/m2 General: Pleasant white male in no acute distress. HEENT: Normal Neck: No adenopathy or JVD. No bruits. No thyromegaly. Lungs: Clear Cardiovascular: Regular rate and rhythm. Normal S1 and S2 without gallop or murmur. Abdomen: Soft and nontender. Extremities: No edema. Pulses are 2+ and symmetric. Skin: Warm and dry. Neuro: Alert and oriented x3. Cranial nerves II through XII are intact. LABORATORY DATA: Lab Results  Component Value Date  WBC 6.9 08/24/2012   HGB 14.4 08/24/2012   HCT 41.7 08/24/2012   PLT 216 08/24/2012   GLUCOSE 94 08/24/2012   CHOL 79 10/18/2012   TRIG 53.0 10/18/2012   HDL 25.30* 10/18/2012   LDLCALC 43 10/18/2012   ALT 33 10/18/2012   AST 22 10/18/2012   NA 138 08/24/2012   K 4.1 08/24/2012   CL 105 08/24/2012   CREATININE 0.79 08/24/2012   BUN 20 08/24/2012   CO2 25 08/24/2012   TSH 0.742 08/20/2012   INR 1.21 08/20/2012   HGBA1C 6.3* 08/20/2012     Assessment / Plan: 1. Coronary disease status post anterior STEMI in January 2014 treated with DES of the proximal LAD. Subsequent DES of the PDA. Continue dual antiplatelet therapy for one year. He will stop Effient in January. Continue low-dose beta blocker and statin therapy indefinitely.. Of note the patient was asymptomatic prior to his event. He is anxious to have some  followup assessment. We will consider a routine stress test on his next followup.  2. Hyperlipidemia, well controlled on high dose Lipitor. Still has a low HDL. Encouraged continued dietary and exercise modification. We'll repeat fasting lab work in 6 months.  3. Lifestyle modification. I recommended he resume regular aerobic exercise. He needs to strike a better work/life balance. He understands the importance of this and is going to try to structure his work accordingly.

## 2013-06-09 ENCOUNTER — Other Ambulatory Visit: Payer: Self-pay

## 2013-08-31 ENCOUNTER — Telehealth: Payer: Self-pay | Admitting: Cardiology

## 2013-08-31 ENCOUNTER — Encounter: Payer: Self-pay | Admitting: Nurse Practitioner

## 2013-08-31 ENCOUNTER — Ambulatory Visit (INDEPENDENT_AMBULATORY_CARE_PROVIDER_SITE_OTHER): Payer: BC Managed Care – PPO | Admitting: Nurse Practitioner

## 2013-08-31 VITALS — BP 140/88 | HR 69 | Ht 67.5 in | Wt 191.4 lb

## 2013-08-31 DIAGNOSIS — R079 Chest pain, unspecified: Secondary | ICD-10-CM

## 2013-08-31 DIAGNOSIS — I2109 ST elevation (STEMI) myocardial infarction involving other coronary artery of anterior wall: Secondary | ICD-10-CM

## 2013-08-31 MED ORDER — CLOPIDOGREL BISULFATE 75 MG PO TABS: 75.0000 mg | ORAL_TABLET | Freq: Every day | ORAL | Status: DC

## 2013-08-31 NOTE — Patient Instructions (Addendum)
Get OTC Prilosec or Nexium and take one a day  Stay on your current medicines but we are going to put you on Plavix 75 mg to take one a day  We will arrange for a stress test (Exercise Myoview)  We will check fasting labs on the day of your stress test  Call the [facility] office at [phone] if you have any questions, problems or concerns.

## 2013-08-31 NOTE — Telephone Encounter (Signed)
Returned call to patient he stated he has been having chest pain off and on for the past 2 weeks.Stated he also has been nauseated.No chest pain at present.Appointment scheduled with Norma FredricksonLori Gerhardt NP this afternoon at 2:30 pm.

## 2013-08-31 NOTE — Progress Notes (Signed)
David Pena Date of Birth: November 13, 1955 Medical Record #[id number]  History of Present Illness: David Pena is seen back today for a work in visit. Seen for Dr. Swaziland. He is a 58 year old male with known CAD. He presented in January of 2014 with anterior STEMI and emergent stenting of the proximal LAD with a 3.0 x 33mm Xience followed by a staged PCI of the PDA with a 2.25 x 12mm Promus. EFwas normal. Other issues include HLD, HTN and past DVT following knee surgery back in 2005.   Last seen back in October - was noting some fatigue and seemed to be "slipping back into old ways".   Comes in today. Here with his wife. Admits that he has gotten off track with taking care of himself. More stress with work. Started noting some vague "nagging" ache in his chest - off and on - not exertional. No precipitating or relieving factors. Some intermitted nausea. Some belching. Nothing like what he had had with his MI. Has stopped his Effient as instructed earlier this month but these symptoms were already present. Exercised for about 20 minutes on Friday and did fine. Not short of breath. No recent labs. Not fasting today.    Current Outpatient Prescriptions  Medication Sig Dispense Refill  . aspirin EC 81 MG EC tablet Take 1 tablet (81 mg total) by mouth daily.      Marland Kitchen atorvastatin (LIPITOR) 80 MG tablet TAKE 1 TABLET (80 MG TOTAL) BY MOUTH DAILY AT 6 PM.  30 tablet  6  . co-enzyme Q-10 30 MG capsule Take 30 mg by mouth daily.      . metoprolol tartrate (LOPRESSOR) 25 MG tablet TAKE 1/2 TABLET BY MOUTH TWICE DAILY.  30 tablet  6  . nitroGLYCERIN (NITROSTAT) 0.4 MG SL tablet Place 1 tablet (0.4 mg total) under the tongue every 5 (five) minutes x 3 doses as needed for chest pain.  25 tablet  3  . zolpidem (AMBIEN) 5 MG tablet Take 5 mg by mouth at bedtime as needed for sleep.       No current facility-administered medications for this visit.    Allergies  Allergen Reactions  . Dilaudid [Hydromorphone]    Night sweats and terrors    Past Medical History  Diagnosis Date  . Sleep apnea   . History of DVT of lower extremity     a. RLE following knee surgery in 2005  . Osteoarthritis     a. s/p arthroscopic surgery - R knee  . PONV (postoperative nausea and vomiting)   . Hypertension   . CAD (coronary artery disease)     a. 08/2012 Ant STEMI/Cath/PCI: LM nl, LAD 100 (3.0x33 Xience Expedition DES), LCX 30p, RCA 44m, PDA 90 (staged PCI w/ 2.25x12 Promus DES on 1/20), EF 55%;  b. 08/2012 Echo: 55-60%, mild LVH, no rwma.    Past Surgical History  Procedure Laterality Date  . Right knee arthroscopy      a. 2005    History  Smoking status  . Never Smoker   Smokeless tobacco  . Not on file    History  Alcohol Use No    Family History  Problem Relation Age of Onset  . Heart failure Father     died in his 88's.  . High Cholesterol Mother     alive @ 20.    Review of Systems: The review of systems is per the HPI.  All other systems were reviewed and are negative.  Physical Exam: BP 140/88  Pulse 69  Ht 5' 7.5" (1.715 m)  Wt 191 lb 6.4 oz (86.818 kg)  BMI 29.52 kg/m2 Patient is very pleasant and in no acute distress. Skin is warm and dry. Color is normal.  HEENT is unremarkable. Normocephalic/atraumatic. PERRL. Sclera are nonicteric. Neck is supple. No masses. No JVD. Lungs are clear. Cardiac exam shows a regular rate and rhythm. Abdomen is soft. Extremities are without edema. Gait and ROM are intact. No gross neurologic deficits noted.  LABORATORY DATA: EKG shows sinus - tracing reviewed with Dr. SwazilandJordan   Lab Results  Component Value Date   WBC 6.9 08/24/2012   HGB 14.4 08/24/2012   HCT 41.7 08/24/2012   PLT 216 08/24/2012   GLUCOSE 94 08/24/2012   CHOL 79 10/18/2012   TRIG 53.0 10/18/2012   HDL 25.30* 10/18/2012   LDLCALC 43 10/18/2012   ALT 33 10/18/2012   AST 22 10/18/2012   NA 138 08/24/2012   K 4.1 08/24/2012   CL 105 08/24/2012   CREATININE 0.79 08/24/2012   BUN 20  08/24/2012   CO2 25 08/24/2012   TSH 0.742 08/20/2012   INR 1.21 08/20/2012   HGBA1C 6.3* 08/20/2012     Assessment / Plan: 1. Atypical chest pain - reviewed with Dr. SwazilandJordan - negative EKG - symptoms seem atypical - will proceed with Myoview - in light of the nature of his presentation last year and his PCI's will place on Plavix. Also instructed him to take an OCT PPI (Prilosec or Nexium) daily. He has NTG on hand. Further disposition to follow.   2. HTN - reports better BP control at home  3. HLD - needs fasting labs checked  Patient is agreeable to this plan and will call if any problems develop in the interim.   Rosalio MacadamiaLori C. Danney Bungert, RN, ANP-C [facility] [address] [place], [place]  [id number] [phone]

## 2013-08-31 NOTE — Telephone Encounter (Signed)
New Message  Pt called// requests a call back to discuss minor symptoms// (No further details) // Please call

## 2013-08-31 NOTE — Addendum Note (Signed)
Addended by: Rosalio MacadamiaGERHARDT, Julyana Woolverton C on: 08/31/2013 03:29 PM   Modules accepted: Orders

## 2013-09-05 ENCOUNTER — Other Ambulatory Visit (INDEPENDENT_AMBULATORY_CARE_PROVIDER_SITE_OTHER): Payer: BC Managed Care – PPO

## 2013-09-05 DIAGNOSIS — I2109 ST elevation (STEMI) myocardial infarction involving other coronary artery of anterior wall: Secondary | ICD-10-CM

## 2013-09-05 LAB — HEPATIC FUNCTION PANEL
ALT: 34 U/L (ref 0–53)
AST: 21 U/L (ref 0–37)
Albumin: 4.2 g/dL (ref 3.5–5.2)
Alkaline Phosphatase: 48 U/L (ref 39–117)
Bilirubin, Direct: 0.1 mg/dL (ref 0.0–0.3)
Total Bilirubin: 1 mg/dL (ref 0.3–1.2)
Total Protein: 7 g/dL (ref 6.0–8.3)

## 2013-09-05 LAB — BASIC METABOLIC PANEL
BUN: 19 mg/dL (ref 6–23)
CO2: 28 mEq/L (ref 19–32)
Calcium: 9.3 mg/dL (ref 8.4–10.5)
Chloride: 107 mEq/L (ref 96–112)
Creatinine, Ser: 0.9 mg/dL (ref 0.4–1.5)
GFR: 98.9 mL/min (ref >60.00)
Glucose, Bld: 98 mg/dL (ref 70–99)
Potassium: 4.4 mEq/L (ref 3.5–5.1)
Sodium: 141 mEq/L (ref 135–145)

## 2013-09-05 LAB — LIPID PANEL
Cholesterol: 93 mg/dL (ref 0–200)
HDL: 31.5 mg/dL — ABNORMAL LOW (ref >39.00)
LDL Cholesterol: 53 mg/dL (ref 0–99)
Total CHOL/HDL Ratio: 3
Triglycerides: 42 mg/dL (ref 0.0–149.0)
VLDL: 8.4 mg/dL (ref 0.0–40.0)

## 2013-09-05 LAB — TSH: TSH: 0.87 u[IU]/mL (ref 0.35–5.50)

## 2013-09-06 ENCOUNTER — Ambulatory Visit (HOSPITAL_COMMUNITY): Payer: BC Managed Care – PPO | Attending: Cardiology | Admitting: Radiology

## 2013-09-06 ENCOUNTER — Encounter: Payer: Self-pay | Admitting: Cardiology

## 2013-09-06 VITALS — BP 114/75 | HR 58 | Ht 67.5 in | Wt 180.0 lb

## 2013-09-06 DIAGNOSIS — I10 Essential (primary) hypertension: Secondary | ICD-10-CM | POA: Insufficient documentation

## 2013-09-06 DIAGNOSIS — R079 Chest pain, unspecified: Secondary | ICD-10-CM

## 2013-09-06 DIAGNOSIS — I2510 Atherosclerotic heart disease of native coronary artery without angina pectoris: Secondary | ICD-10-CM | POA: Insufficient documentation

## 2013-09-06 DIAGNOSIS — Z8249 Family history of ischemic heart disease and other diseases of the circulatory system: Secondary | ICD-10-CM | POA: Insufficient documentation

## 2013-09-06 DIAGNOSIS — E785 Hyperlipidemia, unspecified: Secondary | ICD-10-CM | POA: Insufficient documentation

## 2013-09-06 DIAGNOSIS — R110 Nausea: Secondary | ICD-10-CM | POA: Insufficient documentation

## 2013-09-06 DIAGNOSIS — I2109 ST elevation (STEMI) myocardial infarction involving other coronary artery of anterior wall: Secondary | ICD-10-CM

## 2013-09-06 DIAGNOSIS — I252 Old myocardial infarction: Secondary | ICD-10-CM | POA: Insufficient documentation

## 2013-09-06 MED ORDER — TECHNETIUM TC 99M SESTAMIBI GENERIC - CARDIOLITE
11.0000 | Freq: Once | INTRAVENOUS | Status: AC | PRN
  Administered 2013-09-06: 11 via INTRAVENOUS

## 2013-09-06 MED ORDER — TECHNETIUM TC 99M SESTAMIBI GENERIC - CARDIOLITE
33.0000 | Freq: Once | INTRAVENOUS | Status: AC | PRN
  Administered 2013-09-06: 33 via INTRAVENOUS

## 2013-09-06 NOTE — Progress Notes (Signed)
[facility] SITE 3 NUCLEAR MED [address] [place], [place] [address] [phone]    Cardiology Nuclear Med Study  [name] is a 58 y.o. male     MRN : [id number]     DOB: Mar 18, 1956  Procedure Date: 09/06/2013  Nuclear Med Background Indication for Stress Test:  Evaluation for Ischemia and Stent Patency History:  CAD, MI 2014, Cath, Stents (multi), Echo 2014 EF 55-60% Cardiac Risk Factors: Family History - CAD, Hypertension and Lipids  Symptoms:  Chest Pain and Nausea   Nuclear Pre-Procedure Caffeine/Decaff Intake:  None > 12 hrs NPO After: 7:30am   Lungs:  clear O2 Sat: 97% on room air. IV 0.9% NS with Angio Cath:  20g  IV Site: R Hand x 1, tolerated well IV Started by:  Irean HongPatsy Edwards, RN  Chest Size (in):  44 Cup Size: n/a  Height: 5' 7.5" (1.715 m)  Weight:  180 lb (81.647 kg)  BMI:  Body mass index is 27.76 kg/(m^2). Tech Comments:  Held Lopressor x 24 hrs     Nuclear Med Study 1 or 2 day study: 1 day  Stress Test Type:  Stress  Reading MD: N/A  Order Authorizing Provider:  Peter SwazilandJordan, MD, and Norma FredricksonLori Gerhardt, NP  Resting Radionuclide: Technetium 780m Sestamibi  Resting Radionuclide Dose: 11.0 mCi   Stress Radionuclide:  Technetium 7880m Sestamibi  Stress Radionuclide Dose: 33.0 mCi           Stress Protocol Rest HR: 58 Stress HR: 150  Rest BP: 114/75 Stress BP: 151/63  Exercise Time (min): 9:30 METS: 10.9           Dose of Adenosine (mg):  n/a Dose of Lexiscan: 0.4 mg  Dose of Atropine (mg): n/a Dose of Dobutamine: n/a mcg/kg/min (at max HR)  Stress Test Technologist: Marqueze Ramcharan ChimesSharon Brooks, BS-ES  Nuclear Technologist:  Domenic PoliteStephen Carbone, CNMT     Rest Procedure:  Myocardial perfusion imaging was performed at rest 45 minutes following the intravenous administration of Technetium 6180m Sestamibi. Rest ECG: NSR with non-specific ST-T wave changes  Stress Procedure:  The patient exercised on the treadmill utilizing the Bruce Protocol for 9:30 minutes. The  patient stopped due to fatigue and denied any chest pain.  Technetium 3980m Sestamibi was injected at peak exercise and myocardial perfusion imaging was performed after a brief delay. Stress ECG: No significant change from baseline ECG  QPS Raw Data Images:  Normal; no motion artifact; normal heart/lung ratio. Stress Images:  Normal homogeneous uptake in all areas of the myocardium. Rest Images:  Normal homogeneous uptake in all areas of the myocardium. Subtraction (SDS):  No evidence of ischemia. Transient Ischemic Dilatation (Normal <1.22):  1.20 Lung/Heart Ratio (Normal <0.45):  0.30  Quantitative Gated Spect Images QGS EDV:  105 ml QGS ESV:  37 ml  Impression Exercise Capacity:  Excellent exercise capacity. BP Response:  Normal blood pressure response. Clinical Symptoms:  No significant symptoms noted. ECG Impression:  No significant ST segment change suggestive of ischemia. Comparison with Prior Nuclear Study: No previous nuclear study performed  Overall Impression:  Normal stress nuclear study.  LV Ejection Fraction: 65%.  LV Wall Motion:  NL LV Function; NL Wall Motion   Tobias AlexanderELSON, Orla Estrin, Rexene EdisonH 09/06/2013

## 2013-10-20 ENCOUNTER — Other Ambulatory Visit: Payer: Self-pay | Admitting: Cardiology

## 2013-12-19 ENCOUNTER — Other Ambulatory Visit: Payer: Self-pay | Admitting: Cardiology

## 2014-02-23 ENCOUNTER — Other Ambulatory Visit: Payer: Self-pay | Admitting: Cardiology

## 2014-04-19 ENCOUNTER — Other Ambulatory Visit: Payer: Self-pay | Admitting: Cardiology

## 2014-04-26 ENCOUNTER — Other Ambulatory Visit: Payer: Self-pay | Admitting: Cardiology

## 2014-05-01 ENCOUNTER — Encounter: Payer: Self-pay | Admitting: Nurse Practitioner

## 2014-05-02 ENCOUNTER — Encounter: Payer: Self-pay | Admitting: Cardiology

## 2014-05-02 ENCOUNTER — Ambulatory Visit (INDEPENDENT_AMBULATORY_CARE_PROVIDER_SITE_OTHER): Payer: BC Managed Care – PPO | Admitting: Cardiology

## 2014-05-02 VITALS — BP 122/76 | HR 64 | Ht 67.5 in | Wt 197.2 lb

## 2014-05-02 DIAGNOSIS — I2510 Atherosclerotic heart disease of native coronary artery without angina pectoris: Secondary | ICD-10-CM

## 2014-05-02 DIAGNOSIS — E785 Hyperlipidemia, unspecified: Secondary | ICD-10-CM

## 2014-05-02 DIAGNOSIS — I2109 ST elevation (STEMI) myocardial infarction involving other coronary artery of anterior wall: Secondary | ICD-10-CM

## 2014-05-02 NOTE — Patient Instructions (Signed)
Stop metoprolol   Continue your other therapy  You need to lose weight- reduce Carb and sweet intake.  I will see you in one year

## 2014-05-03 NOTE — Progress Notes (Signed)
David Pena Date of Birth: February 11, 1956 Medical Record #[id number]  History of Present Illness: Mr. Kerins is seen back today for a follow up visit. He has known CAD. He presented in January of 2014 with anterior STEMI and emergent stenting of the proximal LAD with a 3.0 x 33mm Xience followed by a staged PCI of the PDA with a 2.25 x 12mm Promus. EFwas normal. Other issues include HLD, HTN and past DVT following knee surgery back in 2005. He had a normal Myoview in March with EF 65%.  Today he reports he is doing well. He has lost 14 lbs this year with dietary modification. He denies any chest pain or SOB. He does complain of some fatigue.       Current Outpatient Prescriptions  Medication Sig Dispense Refill  . aspirin EC 81 MG EC tablet Take 1 tablet (81 mg total) by mouth daily.      Marland Kitchen atorvastatin (LIPITOR) 80 MG tablet TAKE 1 TABLET BY MOUTH DAILY AT 6PM  30 tablet  0  . clopidogrel (PLAVIX) 75 MG tablet Take 1 tablet (75 mg total) by mouth daily.  90 tablet  3  . co-enzyme Q-10 30 MG capsule Take 30 mg by mouth daily.      . metoprolol tartrate (LOPRESSOR) 25 MG tablet TAKE 1/2 TABLET BY MOUTH TWICE DAILY.  30 tablet  1  . nitroGLYCERIN (NITROSTAT) 0.4 MG SL tablet Place 1 tablet (0.4 mg total) under the tongue every 5 (five) minutes x 3 doses as needed for chest pain.  25 tablet  3  . zolpidem (AMBIEN) 5 MG tablet Take 5 mg by mouth at bedtime as needed for sleep.       No current facility-administered medications for this visit.    Allergies  Allergen Reactions  . Dilaudid [Hydromorphone]     Night sweats and terrors    Past Medical History  Diagnosis Date  . Sleep apnea     on CPAP  . History of DVT of lower extremity     a. RLE following knee surgery in 2005  . Osteoarthritis     a. s/p arthroscopic surgery - R knee  . PONV (postoperative nausea and vomiting)   . Hypertension   . CAD (coronary artery disease)     a. 08/2012 Ant STEMI/Cath/PCI: LM nl, LAD 100 (3.0x33  Xience Expedition DES), LCX 30p, RCA 76m, PDA 90 (staged PCI w/ 2.25x12 Promus DES on 1/20), EF 55%;  b. 08/2012 Echo: 55-60%, mild LVH, no rwma.  . Old MI (myocardial infarction)     Past Surgical History  Procedure Laterality Date  . Right knee arthroscopy      a. 2005    History  Smoking status  . Never Smoker   Smokeless tobacco  . Not on file    History  Alcohol Use No    Family History  Problem Relation Age of Onset  . Heart failure Father     died in his 42's.  . High Cholesterol Mother     alive @ 85.    Review of Systems: The review of systems is per the HPI.  All other systems were reviewed and are negative.  Physical Exam: BP 122/76  Pulse 64  Ht 5' 7.5" (1.715 m)  Wt 197 lb 3.2 oz (89.449 kg)  BMI 30.41 kg/m2 Patient is very pleasant and in no acute distress. Skin is warm and dry. Color is normal.  HEENT is unremarkable. Normocephalic/atraumatic. PERRL. Sclera are nonicteric.  Neck is supple. No masses. No JVD. Lungs are clear. Cardiac exam shows a regular rate and rhythm. Nromal S1-2. No gallop or murmur.Abdomen is soft. Extremities are without edema. Gait and ROM are intact. No gross neurologic deficits noted.  LABORATORY DATA:   Lab Results  Component Value Date   WBC 6.9 08/24/2012   HGB 14.4 08/24/2012   HCT 41.7 08/24/2012   PLT 216 08/24/2012   GLUCOSE 98 09/05/2013   CHOL 93 09/05/2013   TRIG 42.0 09/05/2013   HDL 31.50* 09/05/2013   LDLCALC 53 09/05/2013   ALT 34 09/05/2013   AST 21 09/05/2013   NA 141 09/05/2013   K 4.4 09/05/2013   CL 107 09/05/2013   CREATININE 0.9 09/05/2013   BUN 19 09/05/2013   CO2 28 09/05/2013   TSH 0.87 09/05/2013   INR 1.21 08/20/2012   HGBA1C 6.3* 08/20/2012     Assessment / Plan: 1. CAD s/p anterior STEMI in January 2014 with DES of the LAD and later of the PDA. Normal myoview in March 2015. No angina. Recommend DAPT indefinitely since he is tolerating it well without bleeding or bruising.   2. HTN - well controlled.   3. HLD -  continue high dose statin. He is scheduled for fasting lab work in October with Dr. Azucena CecilSwayne.   4. Fatigue- will stop metoprolol. He is on a trivial dose anyway.  I will follow up in one year.

## 2014-05-19 ENCOUNTER — Other Ambulatory Visit: Payer: Self-pay

## 2014-05-24 ENCOUNTER — Other Ambulatory Visit: Payer: Self-pay | Admitting: Cardiology

## 2014-06-19 ENCOUNTER — Encounter: Payer: Self-pay | Admitting: Cardiology

## 2014-07-13 ENCOUNTER — Encounter (HOSPITAL_COMMUNITY): Payer: Self-pay | Admitting: Cardiology

## 2014-08-31 ENCOUNTER — Other Ambulatory Visit: Payer: Self-pay | Admitting: Nurse Practitioner

## 2014-10-03 ENCOUNTER — Telehealth: Payer: Self-pay | Admitting: Cardiology

## 2014-10-03 DIAGNOSIS — I2109 ST elevation (STEMI) myocardial infarction involving other coronary artery of anterior wall: Secondary | ICD-10-CM

## 2014-10-03 NOTE — Telephone Encounter (Signed)
[name] is returning your call , please call .Marland Kitchen. Thanks

## 2014-10-03 NOTE — Telephone Encounter (Signed)
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

## 2014-10-03 NOTE — Telephone Encounter (Signed)
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

## 2014-10-03 NOTE — Addendum Note (Signed)
Addended by: Meda KlinefelterPUGH, CHERYL JOHNSON D on: 10/03/2014 10:10 AM   Modules accepted: Orders

## 2014-10-03 NOTE — Telephone Encounter (Signed)
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 If this is the case I would go ahead and schedule.   Peter SwazilandJordan MD, [facility]ACC

## 2014-10-13 ENCOUNTER — Encounter (HOSPITAL_COMMUNITY): Payer: Self-pay | Admitting: *Deleted

## 2014-10-24 ENCOUNTER — Telehealth (HOSPITAL_COMMUNITY): Payer: Self-pay

## 2014-10-24 NOTE — Telephone Encounter (Signed)
Encounter complete. 

## 2014-10-26 ENCOUNTER — Ambulatory Visit (HOSPITAL_COMMUNITY)
Admission: RE | Admit: 2014-10-26 | Discharge: 2014-10-26 | Disposition: A | Discharge status: Home / Self Care | Payer: BLUE CROSS/BLUE SHIELD | Source: Ambulatory Visit | Attending: Cardiology | Admitting: Cardiology

## 2014-10-26 DIAGNOSIS — I2109 ST elevation (STEMI) myocardial infarction involving other coronary artery of anterior wall: Secondary | ICD-10-CM

## 2014-10-26 DIAGNOSIS — Z024 Encounter for examination for driving license: Secondary | ICD-10-CM | POA: Diagnosis present

## 2014-10-26 DIAGNOSIS — Z48812 Encounter for surgical aftercare following surgery on the circulatory system: Secondary | ICD-10-CM | POA: Diagnosis not present

## 2014-10-26 NOTE — Procedures (Signed)
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  With Exercise: borderline ST changes; Approximately 1 mm mostly upsloping ST changes in inferior and lateral leads   Other Information Arrhythmia:  No Angina during ETT:  absent (0) Quality of ETT:  diagnostic  ETT Interpretation:  borderline (indeterminate) with non-specific ST changes  Comments: Excellent exercise tolerance Normotensive response to exercise   Thurmon FairMihai Skylynn Burkley, MD, [facility] [phone] office [phone] pager

## 2014-11-23 ENCOUNTER — Other Ambulatory Visit: Payer: Self-pay | Admitting: Cardiology

## 2015-06-11 ENCOUNTER — Telehealth: Payer: Self-pay | Admitting: Cardiology

## 2015-06-11 NOTE — Telephone Encounter (Signed)
Error  Did not need to start a note

## 2015-06-12 ENCOUNTER — Telehealth: Payer: Self-pay

## 2015-06-12 ENCOUNTER — Encounter: Payer: Self-pay | Admitting: Cardiology

## 2015-06-12 ENCOUNTER — Ambulatory Visit (INDEPENDENT_AMBULATORY_CARE_PROVIDER_SITE_OTHER): Payer: BLUE CROSS/BLUE SHIELD | Admitting: Cardiology

## 2015-06-12 VITALS — BP 116/76 | HR 60 | Ht 67.5 in | Wt 194.3 lb

## 2015-06-12 DIAGNOSIS — I2510 Atherosclerotic heart disease of native coronary artery without angina pectoris: Secondary | ICD-10-CM | POA: Diagnosis not present

## 2015-06-12 DIAGNOSIS — E785 Hyperlipidemia, unspecified: Secondary | ICD-10-CM

## 2015-06-12 LAB — BASIC METABOLIC PANEL
BUN: 20 mg/dL (ref 7–25)
CHLORIDE: 106 mmol/L (ref 98–110)
CO2: 27 mmol/L (ref 20–31)
CREATININE: 0.72 mg/dL (ref 0.70–1.33)
Calcium: 9.6 mg/dL (ref 8.6–10.3)
GLUCOSE: 85 mg/dL (ref 65–99)
POTASSIUM: 4.6 mmol/L (ref 3.5–5.3)
Sodium: 141 mmol/L (ref 135–146)

## 2015-06-12 LAB — HEPATIC FUNCTION PANEL
ALBUMIN: 4.5 g/dL (ref 3.6–5.1)
ALK PHOS: 62 U/L (ref 40–115)
ALT: 31 U/L (ref 9–46)
AST: 18 U/L (ref 10–35)
BILIRUBIN DIRECT: 0.2 mg/dL (ref <=0.2)
BILIRUBIN INDIRECT: 0.6 mg/dL (ref 0.2–1.2)
TOTAL PROTEIN: 7 g/dL (ref 6.1–8.1)
Total Bilirubin: 0.8 mg/dL (ref 0.2–1.2)

## 2015-06-12 LAB — CBC WITH DIFFERENTIAL/PLATELET
BASOS PCT: 0 % (ref 0–1)
Basophils Absolute: 0 10*3/uL (ref 0.0–0.1)
EOS PCT: 4 % (ref 0–5)
Eosinophils Absolute: 0.2 10*3/uL (ref 0.0–0.7)
HCT: 44.8 % (ref 39.0–52.0)
Hemoglobin: 15.1 g/dL (ref 13.0–17.0)
Lymphocytes Relative: 34 % (ref 12–46)
Lymphs Abs: 1.9 10*3/uL (ref 0.7–4.0)
MCH: 29.5 pg (ref 26.0–34.0)
MCHC: 33.7 g/dL (ref 30.0–36.0)
MCV: 87.5 fL (ref 78.0–100.0)
MONO ABS: 0.5 10*3/uL (ref 0.1–1.0)
MPV: 10.8 fL (ref 8.6–12.4)
Monocytes Relative: 9 % (ref 3–12)
NEUTROS ABS: 3 10*3/uL (ref 1.7–7.7)
Neutrophils Relative %: 53 % (ref 43–77)
PLATELETS: 260 10*3/uL (ref 150–400)
RBC: 5.12 MIL/uL (ref 4.22–5.81)
RDW: 13.1 % (ref 11.5–15.5)
WBC: 5.7 10*3/uL (ref 4.0–10.5)

## 2015-06-12 LAB — LIPID PANEL
CHOLESTEROL: 107 mg/dL — AB (ref 125–200)
HDL: 29 mg/dL — ABNORMAL LOW (ref >=40)
LDL CALC: 63 mg/dL (ref <130)
TRIGLYCERIDES: 73 mg/dL (ref <150)
Total CHOL/HDL Ratio: 3.7 Ratio (ref <=5.0)
VLDL: 15 mg/dL (ref <30)

## 2015-06-12 MED ORDER — NITROGLYCERIN 0.4 MG SL SUBL: 0.4000 mg | SUBLINGUAL_TABLET | SUBLINGUAL | Status: DC | PRN

## 2015-06-12 NOTE — Patient Instructions (Signed)
Continue your current therapy  We will check fasting lab work today  I will see you in 6 months.  Try and get more exercise

## 2015-06-12 NOTE — Progress Notes (Signed)
David Pena Date of Birth: [birth year] Medical Record #[id number]  History of Present Illness: Mr. Knierim is seen back today for a follow up visit. He has known CAD. He presented in January of 2014 with anterior STEMI and emergent stenting of the proximal LAD with a 3.0 x 33mm Xience followed by a staged PCI of the PDA with a 2.25 x 12mm Promus. EFwas normal. Other issues include HLD, HTN and past DVT following knee surgery back in 2005. He had a normal Myoview in March  2015 with EF 65%. 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  Today he reports he is doing well.  He denies any chest pain or SOB. Energy level is good. . Recently noted numbness, tingling, and pin like pain in 2nd and 3rd toes on right foot. Admits he is not exercising regularly.      Current Outpatient Prescriptions  Medication Sig Dispense Refill  . aspirin EC 81 MG EC tablet Take 1 tablet (81 mg total) by mouth daily.    Marland Kitchen atorvastatin (LIPITOR) 80 MG tablet TAKE 1 TABLET BY MOUTH DAILY AT 6PM 30 tablet 11  . clopidogrel (PLAVIX) 75 MG tablet TAKE 1 TABLET BY MOUTH DAILY. 90 tablet 3  . co-enzyme Q-10 30 MG capsule Take 30 mg by mouth daily.    . nitroGLYCERIN (NITROSTAT) 0.4 MG SL tablet Place 1 tablet (0.4 mg total) under the tongue every 5 (five) minutes x 3 doses as needed for chest pain. 25 tablet 3  . zolpidem (AMBIEN) 5 MG tablet Take 5 mg by mouth at bedtime as needed for sleep.     No current facility-administered medications for this visit.    Allergies  Allergen Reactions  . Dilaudid [Hydromorphone]     Night sweats and terrors    Past Medical History  Diagnosis Date  . Sleep apnea     on CPAP  . History of DVT of lower extremity     a. RLE following knee surgery in 2005  . Osteoarthritis     a. s/p arthroscopic surgery - R knee  . PONV (postoperative nausea and vomiting)   . Hypertension   . CAD (coronary  artery disease)     a. 08/2012 Ant STEMI/Cath/PCI: LM nl, LAD 100 (3.0x33 Xience Expedition DES), LCX 30p, RCA 79m, PDA 90 (staged PCI w/ 2.25x12 Promus DES on 1/20), EF 55%;  b. 08/2012 Echo: 55-60%, mild LVH, no rwma.  . Old MI (myocardial infarction)     Past Surgical History  Procedure Laterality Date  . Right knee arthroscopy      a. 2005  . Left heart catheterization with coronary angiogram N/A 08/20/2012    Procedure: LEFT HEART CATHETERIZATION WITH CORONARY ANGIOGRAM;  Surgeon: Laurey Morale, MD;  Location: [facility] CATH LAB;  Service: Cardiovascular;  Laterality: N/A;  . Percutaneous coronary stent intervention (pci-s) N/A 08/23/2012    Procedure: PERCUTANEOUS CORONARY STENT INTERVENTION (PCI-S);  Surgeon: Peter M Swaziland, MD;  Location: [facility] CATH LAB;  Service: Cardiovascular;  Laterality: N/A;    History  Smoking status  . Never Smoker   Smokeless tobacco  . Not on file    History  Alcohol Use No    Family History  Problem Relation Age of Onset  . Heart failure Father     died in his 71's.  . High Cholesterol Mother     alive @ 38.  Review of Systems: The review of systems is per the HPI.  All other systems were reviewed and are negative.  Physical Exam: BP 116/76 mmHg  Pulse 60  Ht 5' 7.5" (1.715 m)  Wt 88.14 kg (194 lb 5 oz)  BMI 29.97 kg/m2 Patient is very pleasant and in no acute distress. Skin is warm and dry. Color is normal.  HEENT is unremarkable. Normocephalic/atraumatic. PERRL. Sclera are nonicteric. Neck is supple. No masses. No JVD. Lungs are clear. Cardiac exam shows a regular rate and rhythm. Nromal S1-2. No gallop or murmur.Abdomen is soft. Extremities are without edema. Feet are warm and well perfused. normal pedal pulses. Gait and ROM are intact. No gross neurologic deficits noted.  LABORATORY DATA:   Lab Results  Component Value Date   WBC 6.9 08/24/2012   HGB 14.4 08/24/2012   HCT 41.7 08/24/2012   PLT 216 08/24/2012   GLUCOSE 98 09/05/2013    CHOL 93 09/05/2013   TRIG 42.0 09/05/2013   HDL 31.50* 09/05/2013   LDLCALC 53 09/05/2013   ALT 34 09/05/2013   AST 21 09/05/2013   NA 141 09/05/2013   K 4.4 09/05/2013   CL 107 09/05/2013   CREATININE 0.9 09/05/2013   BUN 19 09/05/2013   CO2 28 09/05/2013   TSH 0.87 09/05/2013   INR 1.21 08/20/2012   HGBA1C 6.3* 08/20/2012     Assessment / Plan: 1. CAD s/p anterior STEMI in January 2014 with DES of the LAD and later of the PDA. Normal myoview in March 2015. Normal ETT March 2016.  No angina. Recommend DAPT indefinitely since he is tolerating it well without bleeding or bruising.   2. HTN - well controlled.   3. HLD - continue high dose statin. Will check fasting lab work today.   Encourage increased aerobic activity. Follow up in 6 months.

## 2015-06-12 NOTE — Telephone Encounter (Signed)
Received clearance form for colonoscopy from Dr.Outlaw's office.Dr.Jordan advised ok to stop plavix 7 days prior to colonoscopy.Form faxed back to fax # [phone].

## 2015-06-15 ENCOUNTER — Other Ambulatory Visit: Payer: Self-pay | Admitting: Cardiology

## 2015-06-25 ENCOUNTER — Encounter: Payer: Self-pay | Admitting: Cardiology

## 2015-08-28 ENCOUNTER — Ambulatory Visit: Payer: BLUE CROSS/BLUE SHIELD | Admitting: Cardiology

## 2015-09-20 ENCOUNTER — Encounter: Payer: Self-pay | Admitting: Cardiology

## 2015-09-28 NOTE — Telephone Encounter (Signed)
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

## 2015-10-03 ENCOUNTER — Other Ambulatory Visit: Payer: Self-pay | Admitting: Cardiology

## 2015-10-03 NOTE — Telephone Encounter (Signed)
REFILL 

## 2016-04-29 ENCOUNTER — Other Ambulatory Visit: Payer: Self-pay | Admitting: Cardiology

## 2016-06-30 ENCOUNTER — Other Ambulatory Visit: Payer: Self-pay | Admitting: Cardiology

## 2016-06-30 NOTE — Telephone Encounter (Signed)
REFILL 

## 2016-08-07 ENCOUNTER — Ambulatory Visit: Payer: BLUE CROSS/BLUE SHIELD | Admitting: Cardiology

## 2016-08-11 NOTE — Progress Notes (Signed)
Jairo BenMartin Burkholder Date of Birth: 09/07/1955 Medical Record #[id number]  History of Present Illness: Mr. Mariana Kaufmanobin is seen back today for a follow up visit. He has known CAD. He presented in January of 2014 with anterior STEMI and emergent stenting of the proximal LAD with a 3.0 x 33mm Xience followed by a staged PCI of the PDA with a 2.25 x 12mm Promus. EFwas normal. Other issues include HLD, HTN and past DVT following knee surgery back in 2005. He had a normal Myoview in March  2015 with EF 65%. 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  Today he reports he is doing well. Admits that he hasn't been exercising much and has gained 8 lbs.   He denies any chest pain or SOB. Energy level is good. He does feel lightheaded if he walks up steps quickly or turns.       Current Outpatient Prescriptions  Medication Sig Dispense Refill  . aspirin EC 81 MG EC tablet Take 1 tablet (81 mg total) by mouth daily.    Marland Kitchen. atorvastatin (LIPITOR) 80 MG tablet Take 1 tablet (80 mg total) by mouth daily at 6 PM. KEEP OV. 30 tablet 2  . clopidogrel (PLAVIX) 75 MG tablet TAKE 1 TABLET BY MOUTH DAILY. 90 tablet 1  . co-enzyme Q-10 30 MG capsule Take 30 mg by mouth daily.    . nitroGLYCERIN (NITROSTAT) 0.4 MG SL tablet Place 1 tablet (0.4 mg total) under the tongue every 5 (five) minutes x 3 doses as needed for chest pain. 25 tablet 3  . zolpidem (AMBIEN) 5 MG tablet Take 5 mg by mouth at bedtime as needed for sleep.     No current facility-administered medications for this visit.     Allergies  Allergen Reactions  . Dilaudid [Hydromorphone] Other (See Comments)    Night sweats and terrors    Past Medical History:  Diagnosis Date  . CAD (coronary artery disease)    a. 08/2012 Ant STEMI/Cath/PCI: LM nl, LAD 100 (3.0x33 Xience Expedition DES), LCX 30p, RCA 3848m, PDA 90 (staged PCI w/ 2.25x12 Promus DES on 1/20), EF 55%;  b. 08/2012 Echo:  55-60%, mild LVH, no rwma.  . History of DVT of lower extremity    a. RLE following knee surgery in 2005  . Hypertension   . Old MI (myocardial infarction)   . Osteoarthritis    a. s/p arthroscopic surgery - R knee  . PONV (postoperative nausea and vomiting)   . Sleep apnea    on CPAP    Past Surgical History:  Procedure Laterality Date  . LEFT HEART CATHETERIZATION WITH CORONARY ANGIOGRAM N/A 08/20/2012   Procedure: LEFT HEART CATHETERIZATION WITH CORONARY ANGIOGRAM;  Surgeon: Laurey Moralealton S McLean, MD;  [facility];  Service: Cardiovascular;  Laterality: N/A;  . PERCUTANEOUS CORONARY STENT INTERVENTION (PCI-S) N/A 08/23/2012   Procedure: PERCUTANEOUS CORONARY STENT INTERVENTION (PCI-S);  Surgeon: Aftan Vint M SwazilandJordan, MD;  [facility];  Service: Cardiovascular;  Laterality: N/A;  . Right Knee Arthroscopy     a. 2005    History  Smoking Status  . Never Smoker  Smokeless Tobacco  . Not on file    History  Alcohol Use No    Family History  Problem Relation Age of Onset  . Heart failure Father     died in his 3580's.  . High Cholesterol Mother  alive @ 51.    Review of Systems: The review of systems is per the HPI.  All other systems were reviewed and are negative.  Physical Exam: BP (!) 150/96   Pulse 77   Ht 5' 7.5" (1.715 m)   Wt 202 lb (91.6 kg)   BMI 31.17 kg/m  Patient is very pleasant and in no acute distress. Skin is warm and dry. Color is normal.  HEENT is unremarkable. Normocephalic/atraumatic. PERRL. Sclera are nonicteric. Neck is supple. No masses. No JVD. Lungs are clear. Cardiac exam shows a regular rate and rhythm. Nromal S1-2. No gallop or murmur.Abdomen is soft. Extremities are without edema. Feet are warm and well perfused. normal pedal pulses. Gait and ROM are intact. No gross neurologic deficits noted.  LABORATORY DATA:   Lab Results  Component Value Date   WBC 5.7 06/12/2015   HGB 15.1 06/12/2015   HCT 44.8 06/12/2015   PLT 260  06/12/2015   GLUCOSE 85 06/12/2015   CHOL 107 (L) 06/12/2015   TRIG 73 06/12/2015   HDL 29 (L) 06/12/2015   LDLCALC 63 06/12/2015   ALT 31 06/12/2015   AST 18 06/12/2015   NA 141 06/12/2015   K 4.6 06/12/2015   CL 106 06/12/2015   CREATININE 0.72 06/12/2015   BUN 20 06/12/2015   CO2 27 06/12/2015   TSH 0.87 09/05/2013   INR 1.21 08/20/2012   HGBA1C 6.3 (H) 08/20/2012   Labs dated 03/06/16: cholesterol 107, triglycerides 70, LDL 62, HDL 31. CMET normal.  Ecg today shows NSR with normal Ecg. I have personally reviewed and interpreted this study.   Assessment / Plan: 1. CAD s/p anterior STEMI in January 2014 with DES of the LAD and later of the PDA. Normal myoview in March 2015. Normal ETT March 2016.  No angina. Recommend DAPT indefinitely since he is tolerating it well without bleeding or bruising.   2. HTN - elevated today. Normally OK. Will monitor at home and let me know if it is staying high.  3. HLD - continue high dose statin. Focus on lifestyle modification and weight loss.  Encourage increased aerobic activity. Follow up in one year.

## 2016-08-12 ENCOUNTER — Encounter: Payer: Self-pay | Admitting: Cardiology

## 2016-08-12 ENCOUNTER — Ambulatory Visit (INDEPENDENT_AMBULATORY_CARE_PROVIDER_SITE_OTHER): Payer: 59 | Admitting: Cardiology

## 2016-08-12 VITALS — BP 150/96 | HR 77 | Ht 67.5 in | Wt 202.0 lb

## 2016-08-12 DIAGNOSIS — E7800 Pure hypercholesterolemia, unspecified: Secondary | ICD-10-CM | POA: Diagnosis not present

## 2016-08-12 DIAGNOSIS — I2510 Atherosclerotic heart disease of native coronary artery without angina pectoris: Secondary | ICD-10-CM | POA: Diagnosis not present

## 2016-08-12 MED ORDER — NITROGLYCERIN 0.4 MG SL SUBL: 0.4000 mg | SUBLINGUAL_TABLET | SUBLINGUAL | 3 refills | Status: DC | PRN

## 2016-08-12 NOTE — Patient Instructions (Signed)
You need to work on regular aerobic exercise and lose weight  Continue your current therapy  Monitor your blood pressure. Let me know if it is staying high.

## 2016-08-14 ENCOUNTER — Encounter: Payer: Self-pay | Admitting: Cardiology

## 2016-08-15 ENCOUNTER — Telehealth: Payer: Self-pay | Admitting: Cardiology

## 2016-08-15 ENCOUNTER — Telehealth: Payer: Self-pay

## 2016-08-15 MED ORDER — METOPROLOL SUCCINATE ER 25 MG PO TB24: 25.0000 mg | ORAL_TABLET | Freq: Every day | ORAL | 6 refills | Status: DC

## 2016-08-15 NOTE — Telephone Encounter (Signed)
Follow Up:; ° ° °Returning your call. °

## 2016-08-15 NOTE — Telephone Encounter (Signed)
Returned call to patient.Dr.Jordan advised to start Toprol XL 25 mg daily.Advised to continue to monitor pulse and B/P.Call back if needed.

## 2016-08-15 NOTE — Telephone Encounter (Signed)
Patient called no answer.Left message on personal voice mail received your email yesterday.Advised to call me back for Dr.Jordan's recommendations.

## 2016-10-07 ENCOUNTER — Other Ambulatory Visit: Payer: Self-pay | Admitting: Cardiology

## 2016-10-21 ENCOUNTER — Encounter: Payer: Self-pay | Admitting: Cardiology

## 2016-11-06 ENCOUNTER — Other Ambulatory Visit: Payer: Self-pay | Admitting: Cardiology

## 2017-02-15 ENCOUNTER — Other Ambulatory Visit: Payer: Self-pay | Admitting: Nurse Practitioner

## 2017-03-10 ENCOUNTER — Other Ambulatory Visit: Payer: Self-pay | Admitting: Cardiology

## 2017-04-21 ENCOUNTER — Encounter: Payer: Self-pay | Admitting: Cardiology

## 2017-04-22 ENCOUNTER — Telehealth: Payer: Self-pay | Admitting: *Deleted

## 2017-04-22 MED ORDER — METOPROLOL SUCCINATE ER 25 MG PO TB24: 25.0000 mg | ORAL_TABLET | Freq: Every day | ORAL | 1 refills | Status: DC

## 2017-04-22 NOTE — Telephone Encounter (Signed)
Patient's metoprolol has been refilled with a 3 month supply per his request.

## 2017-07-29 DIAGNOSIS — G4730 Sleep apnea, unspecified: Secondary | ICD-10-CM | POA: Insufficient documentation

## 2017-07-29 DIAGNOSIS — R112 Nausea with vomiting, unspecified: Secondary | ICD-10-CM | POA: Insufficient documentation

## 2017-07-29 DIAGNOSIS — M1990 Unspecified osteoarthritis, unspecified site: Secondary | ICD-10-CM | POA: Insufficient documentation

## 2017-07-29 DIAGNOSIS — Z9889 Other specified postprocedural states: Secondary | ICD-10-CM

## 2017-07-29 DIAGNOSIS — Z86718 Personal history of other venous thrombosis and embolism: Secondary | ICD-10-CM | POA: Insufficient documentation

## 2017-07-29 DIAGNOSIS — I2510 Atherosclerotic heart disease of native coronary artery without angina pectoris: Secondary | ICD-10-CM | POA: Insufficient documentation

## 2017-07-29 DIAGNOSIS — I10 Essential (primary) hypertension: Secondary | ICD-10-CM | POA: Insufficient documentation

## 2017-07-29 DIAGNOSIS — I252 Old myocardial infarction: Secondary | ICD-10-CM | POA: Insufficient documentation

## 2017-08-08 NOTE — Progress Notes (Signed)
David Pena Date of Birth: Mar 29, 1956 Medical Record #[id number]  History of Present Illness: David Pena is seen back today for follow up CAD.  He presented in January of 2014 with anterior STEMI and emergent stenting of the proximal LAD with a 3.0 x 33mm Xience followed by a staged PCI of the PDA with a 2.25 x 12mm Promus. EF was normal. Other issues include HLD, HTN and past DVT following knee surgery back in 2005. He had a normal Myoview in March  2015 with EF 65%. 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  Today he reports he is doing well. He has been walking for 30 minutes 4x/wk.   He denies any chest pain or SOB. Energy level is good. He went on a really good diet for about 4-5 months and lost 18 lbs but has since gained a lot back. He is back on his diet now.      Current Outpatient Medications  Medication Sig Dispense Refill  . aspirin EC 81 MG EC tablet Take 1 tablet (81 mg total) by mouth daily.    Marland Kitchen. atorvastatin (LIPITOR) 80 MG tablet Take 1 tablet (80 mg total) by mouth daily at 6 PM. 30 tablet 10  . clopidogrel (PLAVIX) 75 MG tablet TAKE 1 TABLET BY MOUTH DAILY 90 tablet 2  . co-enzyme Q-10 30 MG capsule Take 30 mg by mouth daily.    . metoprolol succinate (TOPROL-XL) 25 MG 24 hr tablet Take 1 tablet (25 mg total) by mouth daily. 90 tablet 1  . nitroGLYCERIN (NITROSTAT) 0.4 MG SL tablet Place 1 tablet (0.4 mg total) under the tongue every 5 (five) minutes x 3 doses as needed for chest pain. 25 tablet 3  . zolpidem (AMBIEN) 5 MG tablet Take 5 mg by mouth at bedtime as needed for sleep.     No current facility-administered medications for this visit.     Allergies  Allergen Reactions  . Dilaudid [Hydromorphone] Other (See Comments)    Night sweats and terrors    Past Medical History:  Diagnosis Date  . CAD (coronary artery disease)    a. 08/2012 Ant STEMI/Cath/PCI: LM nl, LAD 100  (3.0x33 Xience Expedition DES), LCX 30p, RCA 164m, PDA 90 (staged PCI w/ 2.25x12 Promus DES on 1/20), EF 55%;  b. 08/2012 Echo: 55-60%, mild LVH, no rwma.  . History of DVT of lower extremity    a. RLE following knee surgery in 2005  . Hypertension   . Old MI (myocardial infarction)   . Osteoarthritis    a. s/p arthroscopic surgery - R knee  . PONV (postoperative nausea and vomiting)   . PONV (postoperative nausea and vomiting)   . Sleep apnea    on CPAP    Past Surgical History:  Procedure Laterality Date  . LEFT HEART CATHETERIZATION WITH CORONARY ANGIOGRAM N/A 08/20/2012   Procedure: LEFT HEART CATHETERIZATION WITH CORONARY ANGIOGRAM;  Surgeon: Laurey Moralealton S McLean, MD;  [facility];  Service: Cardiovascular;  Laterality: N/A;  . PERCUTANEOUS CORONARY STENT INTERVENTION (PCI-S) N/A 08/23/2012   Procedure: PERCUTANEOUS CORONARY STENT INTERVENTION (PCI-S);  Surgeon: Toniette Devera M SwazilandJordan, MD;  [facility];  Service: Cardiovascular;  Laterality: N/A;  . Right Knee Arthroscopy     a. 2005    Social History   Tobacco Use  Smoking Status Never Smoker    Social History   Substance  and Sexual Activity  Alcohol Use No    Family History  Problem Relation Age of Onset  . Heart failure Father        died in his 32's.  . High Cholesterol Mother        alive @ 31.    Review of Systems: The review of systems is per the HPI.  All other systems were reviewed and are negative.  Physical Exam: BP 116/72 (BP Location: Left Arm, Patient Position: Sitting, Cuff Size: Large)   Pulse 62   Ht 5' 7.5" (1.715 m)   Wt 201 lb 3.2 oz (91.3 kg)   BMI 31.05 kg/m  GENERAL:  Well appearing WM in NAD HEENT:  PERRL, EOMI, sclera are clear. Oropharynx is clear. NECK:  No jugular venous distention, carotid upstroke brisk and symmetric, no bruits, no thyromegaly or adenopathy LUNGS:  Clear to auscultation bilaterally CHEST:  Unremarkable HEART:  RRR,  PMI not displaced or sustained,S1 and  S2 within normal limits, no S3, no S4: no clicks, no rubs, no murmurs ABD:  Soft, nontender. BS +, no masses or bruits. No hepatomegaly, no splenomegaly EXT:  2 + pulses throughout, no edema, no cyanosis no clubbing SKIN:  Warm and dry.  No rashes NEURO:  Alert and oriented x 3. Cranial nerves II through XII intact. PSYCH:  Cognitively intact    LABORATORY DATA:   Lab Results  Component Value Date   WBC 5.7 06/12/2015   HGB 15.1 06/12/2015   HCT 44.8 06/12/2015   PLT 260 06/12/2015   GLUCOSE 85 06/12/2015   CHOL 107 (L) 06/12/2015   TRIG 73 06/12/2015   HDL 29 (L) 06/12/2015   LDLCALC 63 06/12/2015   ALT 31 06/12/2015   AST 18 06/12/2015   NA 141 06/12/2015   K 4.6 06/12/2015   CL 106 06/12/2015   CREATININE 0.72 06/12/2015   BUN 20 06/12/2015   CO2 27 06/12/2015   TSH 0.87 09/05/2013   INR 1.21 08/20/2012   HGBA1C 6.3 (H) 08/20/2012   Labs dated 03/06/16: cholesterol 107, triglycerides 70, LDL 62, HDL 31. CMET normal.   Assessment / Plan: 1. CAD s/p anterior STEMI in January 2014 with DES of the LAD and later of the PDA. Normal myoview in March 2015. Normal ETT March 2016.  No angina. Recommend DAPT indefinitely since he is tolerating it well without bleeding or bruising. 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   2. HTN - well controlled on low dose metoprolol  3. HLD - continue high dose statin. Focus on lifestyle modification and weight loss. Will check fasting labs today.  Follow up in one year.

## 2017-08-12 ENCOUNTER — Encounter: Payer: Self-pay | Admitting: Cardiology

## 2017-08-13 ENCOUNTER — Ambulatory Visit: Payer: 59 | Admitting: Cardiology

## 2017-08-13 ENCOUNTER — Encounter: Payer: Self-pay | Admitting: Cardiology

## 2017-08-13 VITALS — BP 116/72 | HR 62 | Ht 67.5 in | Wt 201.2 lb

## 2017-08-13 DIAGNOSIS — E7800 Pure hypercholesterolemia, unspecified: Secondary | ICD-10-CM | POA: Diagnosis not present

## 2017-08-13 DIAGNOSIS — I2510 Atherosclerotic heart disease of native coronary artery without angina pectoris: Secondary | ICD-10-CM

## 2017-08-13 NOTE — Patient Instructions (Signed)
Continue your current therapy  We will schedule you for a stress test  I will see you in one year. 

## 2017-08-14 ENCOUNTER — Telehealth (HOSPITAL_COMMUNITY): Payer: Self-pay

## 2017-08-14 ENCOUNTER — Encounter: Payer: Self-pay | Admitting: Cardiology

## 2017-08-14 LAB — BASIC METABOLIC PANEL
BUN/Creatinine Ratio: 32 — ABNORMAL HIGH (ref 10–24)
BUN: 24 mg/dL (ref 8–27)
CO2: 23 mmol/L (ref 20–29)
CREATININE: 0.76 mg/dL (ref 0.76–1.27)
Calcium: 9.4 mg/dL (ref 8.6–10.2)
Chloride: 106 mmol/L (ref 96–106)
GFR calc Af Amer: 115 mL/min/{1.73_m2} (ref >59)
GFR calc non Af Amer: 99 mL/min/{1.73_m2} (ref >59)
GLUCOSE: 102 mg/dL — AB (ref 65–99)
Potassium: 4.7 mmol/L (ref 3.5–5.2)
SODIUM: 142 mmol/L (ref 134–144)

## 2017-08-14 LAB — CBC WITH DIFFERENTIAL/PLATELET
BASOS ABS: 0 10*3/uL (ref 0.0–0.2)
Basos: 0 %
EOS (ABSOLUTE): 0.3 10*3/uL (ref 0.0–0.4)
Eos: 5 %
HEMATOCRIT: 44.1 % (ref 37.5–51.0)
Hemoglobin: 15.2 g/dL (ref 13.0–17.7)
Immature Grans (Abs): 0 10*3/uL (ref 0.0–0.1)
Immature Granulocytes: 0 %
LYMPHS ABS: 1.8 10*3/uL (ref 0.7–3.1)
Lymphs: 32 %
MCH: 30.2 pg (ref 26.6–33.0)
MCHC: 34.5 g/dL (ref 31.5–35.7)
MCV: 88 fL (ref 79–97)
MONOCYTES: 7 %
Monocytes Absolute: 0.4 10*3/uL (ref 0.1–0.9)
Neutrophils Absolute: 3.1 10*3/uL (ref 1.4–7.0)
Neutrophils: 56 %
Platelets: 219 10*3/uL (ref 150–379)
RBC: 5.03 x10E6/uL (ref 4.14–5.80)
RDW: 13.3 % (ref 12.3–15.4)
WBC: 5.6 10*3/uL (ref 3.4–10.8)

## 2017-08-14 LAB — HEPATIC FUNCTION PANEL
ALT: 28 IU/L (ref 0–44)
AST: 19 IU/L (ref 0–40)
Albumin: 4.5 g/dL (ref 3.6–4.8)
Alkaline Phosphatase: 59 IU/L (ref 39–117)
BILIRUBIN, DIRECT: 0.17 mg/dL (ref 0.00–0.40)
Bilirubin Total: 0.5 mg/dL (ref 0.0–1.2)
Total Protein: 6.5 g/dL (ref 6.0–8.5)

## 2017-08-14 LAB — LIPID PANEL W/O CHOL/HDL RATIO
CHOLESTEROL TOTAL: 94 mg/dL — AB (ref 100–199)
HDL: 32 mg/dL — ABNORMAL LOW (ref >39)
LDL CALC: 50 mg/dL (ref 0–99)
Triglycerides: 61 mg/dL (ref 0–149)
VLDL CHOLESTEROL CAL: 12 mg/dL (ref 5–40)

## 2017-08-14 NOTE — Telephone Encounter (Signed)
Encounter complete. 

## 2017-08-19 ENCOUNTER — Ambulatory Visit (HOSPITAL_COMMUNITY)
Admission: RE | Admit: 2017-08-19 | Discharge: 2017-08-19 | Disposition: A | Discharge status: Home / Self Care | Payer: 59 | Source: Ambulatory Visit | Attending: Cardiology | Admitting: Cardiology

## 2017-08-19 DIAGNOSIS — E7800 Pure hypercholesterolemia, unspecified: Secondary | ICD-10-CM | POA: Diagnosis not present

## 2017-08-19 DIAGNOSIS — I2510 Atherosclerotic heart disease of native coronary artery without angina pectoris: Secondary | ICD-10-CM | POA: Diagnosis not present

## 2017-08-19 LAB — EXERCISE TOLERANCE TEST
CHL CUP RESTING HR STRESS: 59 {beats}/min
CSEPEDS: 19 s
CSEPEW: 12.2 METS
CSEPHR: 96 %
CSEPPHR: 155 {beats}/min
Exercise duration (min): 10 min
MPHR: 160 {beats}/min
RPE: 17

## 2017-10-31 ENCOUNTER — Other Ambulatory Visit: Payer: Self-pay | Admitting: Cardiology

## 2017-11-02 NOTE — Telephone Encounter (Signed)
REFILL 

## 2017-12-03 ENCOUNTER — Other Ambulatory Visit: Payer: Self-pay | Admitting: Nurse Practitioner

## 2018-03-14 ENCOUNTER — Other Ambulatory Visit: Payer: Self-pay

## 2018-03-14 ENCOUNTER — Emergency Department (HOSPITAL_BASED_OUTPATIENT_CLINIC_OR_DEPARTMENT_OTHER): Payer: 59

## 2018-03-14 ENCOUNTER — Encounter (HOSPITAL_BASED_OUTPATIENT_CLINIC_OR_DEPARTMENT_OTHER): Payer: Self-pay | Admitting: Emergency Medicine

## 2018-03-14 ENCOUNTER — Emergency Department (HOSPITAL_BASED_OUTPATIENT_CLINIC_OR_DEPARTMENT_OTHER)
Admission: EM | Admit: 2018-03-14 | Discharge: 2018-03-14 | Disposition: A | Discharge status: Home / Self Care | Payer: 59 | Attending: Emergency Medicine | Admitting: Emergency Medicine

## 2018-03-14 DIAGNOSIS — I10 Essential (primary) hypertension: Secondary | ICD-10-CM | POA: Diagnosis not present

## 2018-03-14 DIAGNOSIS — Z79899 Other long term (current) drug therapy: Secondary | ICD-10-CM | POA: Diagnosis not present

## 2018-03-14 DIAGNOSIS — I2510 Atherosclerotic heart disease of native coronary artery without angina pectoris: Secondary | ICD-10-CM | POA: Insufficient documentation

## 2018-03-14 DIAGNOSIS — Z7902 Long term (current) use of antithrombotics/antiplatelets: Secondary | ICD-10-CM | POA: Insufficient documentation

## 2018-03-14 DIAGNOSIS — I252 Old myocardial infarction: Secondary | ICD-10-CM | POA: Diagnosis not present

## 2018-03-14 DIAGNOSIS — M79661 Pain in right lower leg: Secondary | ICD-10-CM | POA: Diagnosis not present

## 2018-03-14 DIAGNOSIS — Z7982 Long term (current) use of aspirin: Secondary | ICD-10-CM | POA: Insufficient documentation

## 2018-03-14 NOTE — ED Provider Notes (Signed)
[facility] EMERGENCY DEPARTMENT Provider Note   CSN: [id number] Arrival date & time: 03/14/18  1346     History   Chief Complaint Chief Complaint  Patient presents with  . Leg Pain    HPI David Pena is a 62 y.o. male.  HPI  62 year old male with a history of DVT following knee surgery in 2005 presents with right calf pain.  Started 2 days ago.  He often gets cramps in his legs but they seem to come and go, mostly with sleeping.  However since waking up 2 days ago he has been having continued pain in his calf.  No trauma.  Wife felt like his calf might be a little more swollen.  No chest pain or shortness of breath.  Pain is about a 4 out of 10.  Has not taken anything for the pain.  No weakness or numbness. Is concerned for DVT.  Past Medical History:  Diagnosis Date  . CAD (coronary artery disease)    a. 08/2012 Ant STEMI/Cath/PCI: LM nl, LAD 100 (3.0x33 Xience Expedition DES), LCX 30p, RCA 76m, PDA 90 (staged PCI w/ 2.25x12 Promus DES on 1/20), EF 55%;  b. 08/2012 Echo: 55-60%, mild LVH, no rwma.  . History of DVT of lower extremity    a. RLE following knee surgery in 2005  . Hypertension   . Old MI (myocardial infarction)   . Osteoarthritis    a. s/p arthroscopic surgery - R knee  . PONV (postoperative nausea and vomiting)   . PONV (postoperative nausea and vomiting)   . Sleep apnea    on CPAP    Patient Active Problem List   Diagnosis Date Noted  . Sleep apnea   . PONV (postoperative nausea and vomiting)   . Osteoarthritis   . Old MI (myocardial infarction)   . Hypertension   . History of DVT of lower extremity   . CAD (coronary artery disease)   . Coronary artery disease involving native coronary artery of native heart without angina pectoris 08/24/2012  . Hyperlipidemia 08/24/2012  . ST elevation myocardial infarction (STEMI) of anterior wall (HCC) 08/20/2012  . OSA (obstructive sleep apnea) 08/20/2012    Past Surgical History:  Procedure  Laterality Date  . LEFT HEART CATHETERIZATION WITH CORONARY ANGIOGRAM N/A 08/20/2012   Procedure: LEFT HEART CATHETERIZATION WITH CORONARY ANGIOGRAM;  Surgeon: Laurey Morale, MD;  Location: [facility] CATH LAB;  Service: Cardiovascular;  Laterality: N/A;  . PERCUTANEOUS CORONARY STENT INTERVENTION (PCI-S) N/A 08/23/2012   Procedure: PERCUTANEOUS CORONARY STENT INTERVENTION (PCI-S);  Surgeon: Peter M Swaziland, MD;  Location: [facility] CATH LAB;  Service: Cardiovascular;  Laterality: N/A;  . Right Knee Arthroscopy     a. 2005        Home Medications    Prior to Admission medications   Medication Sig Start Date End Date Taking? Authorizing Provider  aspirin EC 81 MG EC tablet Take 1 tablet (81 mg total) by mouth daily. 08/24/12   Creig Hines, NP  atorvastatin (LIPITOR) 80 MG tablet TAKE 1 TABLET BY MOUTH DAILY AT 6PM 11/02/17   [place], Peter M, MD  clopidogrel (PLAVIX) 75 MG tablet TAKE 1 TABLET BY MOUTH DAILY 12/04/17   Rosalio Macadamia, NP  co-enzyme Q-10 30 MG capsule Take 30 mg by mouth daily.    [provider]  metoprolol succinate (TOPROL-XL) 25 MG 24 hr tablet TAKE ONE TABLET BY MOUTH ONE TIME DAILY  11/02/17   [place], Peter M, MD  nitroGLYCERIN (NITROSTAT)  0.4 MG SL tablet Place 1 tablet (0.4 mg total) under the tongue every 5 (five) minutes x 3 doses as needed for chest pain. 08/12/16   [place], Peter M, MD  zolpidem (AMBIEN) 5 MG tablet Take 5 mg by mouth at bedtime as needed for sleep.    [provider]    Family History Family History  Problem Relation Age of Onset  . Heart failure Father        died in his 79's.  . High Cholesterol Mother        alive @ 1.    Social History Social History   Tobacco Use  . Smoking status: Never Smoker  . Smokeless tobacco: Never Used  Substance Use Topics  . Alcohol use: No  . Drug use: No     Allergies   Dilaudid [hydromorphone]   Review of Systems Review of Systems  Respiratory: Negative for shortness of breath.    Cardiovascular: Negative for chest pain.  Musculoskeletal: Positive for myalgias.  Neurological: Negative for weakness and numbness.     Physical Exam Updated Vital Signs BP 134/71 (BP Location: Right Arm)   Pulse 61   Temp 98.5 F (36.9 C) (Oral)   Resp 16   Ht 5\' 8"  (1.727 m)   Wt 90.7 kg   SpO2 95%   BMI 30.41 kg/m   Physical Exam  Constitutional: He is oriented to person, place, and time. He appears well-developed and well-nourished.  HENT:  Head: Normocephalic and atraumatic.  Nose: Nose normal.  Eyes: Right eye exhibits no discharge. Left eye exhibits no discharge.  Cardiovascular: Normal rate and regular rhythm.  Pulses:      Dorsalis pedis pulses are 2+ on the right side, and 2+ on the left side.  Pulmonary/Chest: Effort normal.  Abdominal: He exhibits no distension.  Musculoskeletal: He exhibits no edema.       Right knee: He exhibits normal range of motion and no swelling. No tenderness found.       Right upper leg: He exhibits no tenderness.       Right lower leg: He exhibits tenderness (posterior). He exhibits no swelling and no edema.  Mild right calf tenderness Legs appear symmetrical  Neurological: He is alert and oriented to person, place, and time.  Skin: Skin is warm and dry.  Nursing note and vitals reviewed.    ED Treatments / Results  Labs (all labs ordered are listed, but only abnormal results are displayed) Labs Reviewed - No data to display  EKG None  Radiology US Venous Img Lower Unilateral Right  Result Date: 03/14/2018 CLINICAL DATA:  Right calf pain for 2 days EXAM: RIGHT LOWER EXTREMITY VENOUS DOPPLER ULTRASOUND TECHNIQUE: Gray-scale sonography with graded compression, as well as color Doppler and duplex ultrasound were performed to evaluate the lower extremity deep venous systems from the level of the common femoral vein and including the common femoral, femoral, profunda femoral, popliteal and calf veins including the posterior  tibial, peroneal and gastrocnemius veins when visible. The superficial great saphenous vein was also interrogated. Spectral Doppler was utilized to evaluate flow at rest and with distal augmentation maneuvers in the common femoral, femoral and popliteal veins. COMPARISON:  None. FINDINGS: Contralateral Common Femoral Vein: Respiratory phasicity is normal and symmetric with the symptomatic side. No evidence of thrombus. Normal compressibility. Common Femoral Vein: No evidence of thrombus. Normal compressibility, respiratory phasicity and response to augmentation. Saphenofemoral Junction: No evidence of thrombus. Normal compressibility and flow on color Doppler  imaging. Profunda Femoral Vein: No evidence of thrombus. Normal compressibility and flow on color Doppler imaging. Femoral Vein: No evidence of thrombus. Normal compressibility, respiratory phasicity and response to augmentation. Popliteal Vein: No evidence of thrombus. Normal compressibility, respiratory phasicity and response to augmentation. Calf Veins: No evidence of thrombus. Normal compressibility and flow on color Doppler imaging. Superficial Great Saphenous Vein: No evidence of thrombus. Normal compressibility. Venous Reflux:  None. Other Findings:  None. IMPRESSION: No evidence of deep venous thrombosis. Electronically Signed   By: Elige KoHetal  Patel   On: 03/14/2018 16:38    Procedures Procedures (including critical care time)  Medications Ordered in ED Medications - No data to display   Initial Impression / Assessment and Plan / ED Course  I have reviewed the triage vital signs and the nursing notes.  Pertinent labs & imaging results that were available during my care of the patient were reviewed by me and considered in my medical decision making (see chart for details).     DVT ultrasound is negative.  Unclear why is having the persistent right calf pain.  Mildly tender to palpation.  Possibly a strain.  He does have frequent cramps, but  given this is focally in one muscle group, I have low suspicion for significant elect light disturbance and do not think lab work is needed. Return precautions.  Final Clinical Impressions(s) / ED Diagnoses   Final diagnoses:  Right calf pain    ED Discharge Orders    None       Pricilla LovelessGoldston, Kyli Sorter, MD 03/14/18 1710

## 2018-03-14 NOTE — ED Triage Notes (Signed)
Patient states that he has a hx of right sided DVTS post knee surgery. The patient states that he started to have cramps to his right leg on Friday and today wife notes swelling to his right calf

## 2018-03-15 DIAGNOSIS — I2510 Atherosclerotic heart disease of native coronary artery without angina pectoris: Secondary | ICD-10-CM

## 2018-03-15 MED ORDER — NITROGLYCERIN 0.4 MG SL SUBL: 0.4000 mg | SUBLINGUAL_TABLET | SUBLINGUAL | 3 refills | Status: DC | PRN

## 2018-08-11 DIAGNOSIS — E291 Testicular hypofunction: Secondary | ICD-10-CM | POA: Diagnosis not present

## 2018-08-11 DIAGNOSIS — R635 Abnormal weight gain: Secondary | ICD-10-CM | POA: Diagnosis not present

## 2018-08-13 DIAGNOSIS — L814 Other melanin hyperpigmentation: Secondary | ICD-10-CM | POA: Diagnosis not present

## 2018-08-13 DIAGNOSIS — L821 Other seborrheic keratosis: Secondary | ICD-10-CM | POA: Diagnosis not present

## 2018-08-13 DIAGNOSIS — D1801 Hemangioma of skin and subcutaneous tissue: Secondary | ICD-10-CM | POA: Diagnosis not present

## 2018-08-13 DIAGNOSIS — D229 Melanocytic nevi, unspecified: Secondary | ICD-10-CM | POA: Diagnosis not present

## 2018-08-16 NOTE — Progress Notes (Signed)
David Pena Date of Birth: 1955-09-01 Medical Record #[id number]  History of Present Illness: [name] is seen back today for follow up CAD.  He presented in January of 2014 with anterior STEMI and emergent stenting of the proximal LAD with a 3.0 x 37mm Xience followed by a staged PCI of the PDA with a 2.25 x 86mm Promus. EF was normal. Other issues include HLD, HTN and past DVT following knee surgery back in 2005. He had a normal Myoview in March  2015 with EF 65%. 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 Repeat ETT in January 2019 was also normal.   On follow up today he feels very well. No chest pain, dyspnea or palpitations. Has been going to [facility] and is trying to be more proactive in his health. Has joined a gym. Weight has yo-yoed the last 2 years - hoping to be more consistent with his diet. Had labs done 2 weeks ago and notes cholesterol 97, HDL 34. BS 107      Current Outpatient Medications  Medication Sig Dispense Refill  . aspirin EC 81 MG EC tablet Take 1 tablet (81 mg total) by mouth daily.    Marland Kitchen atorvastatin (LIPITOR) 80 MG tablet TAKE 1 TABLET BY MOUTH DAILY AT 6PM 90 tablet 3  . clopidogrel (PLAVIX) 75 MG tablet TAKE 1 TABLET BY MOUTH DAILY 90 tablet 2  . co-enzyme Q-10 30 MG capsule Take 30 mg by mouth daily.    . metoprolol succinate (TOPROL-XL) 25 MG 24 hr tablet TAKE ONE TABLET BY MOUTH ONE TIME DAILY  90 tablet 3  . nitroGLYCERIN (NITROSTAT) 0.4 MG SL tablet Place 1 tablet (0.4 mg total) under the tongue every 5 (five) minutes x 3 doses as needed for chest pain. 25 tablet 3  . zolpidem (AMBIEN) 5 MG tablet Take 5 mg by mouth at bedtime as needed for sleep.     No current facility-administered medications for this visit.     Allergies  Allergen Reactions  . Dilaudid [Hydromorphone] Other (See Comments)    Night sweats and terrors    Past Medical History:  Diagnosis Date    . CAD (coronary artery disease)    a. 08/2012 Ant STEMI/Cath/PCI: LM nl, LAD 100 (3.0x33 Xience Expedition DES), LCX 30p, RCA 71m, PDA 90 (staged PCI w/ 2.25x12 Promus DES on 1/20), EF 55%;  b. 08/2012 Echo: 55-60%, mild LVH, no rwma.  . History of DVT of lower extremity    a. RLE following knee surgery in 2005  . Hypertension   . Old MI (myocardial infarction)   . Osteoarthritis    a. s/p arthroscopic surgery - R knee  . PONV (postoperative nausea and vomiting)   . PONV (postoperative nausea and vomiting)   . Sleep apnea    on CPAP    Past Surgical History:  Procedure Laterality Date  . LEFT HEART CATHETERIZATION WITH CORONARY ANGIOGRAM N/A 08/20/2012   Procedure: LEFT HEART CATHETERIZATION WITH CORONARY ANGIOGRAM;  Surgeon: Laurey Morale, MD;  Location: [facility] CATH LAB;  Service: Cardiovascular;  Laterality: N/A;  . PERCUTANEOUS CORONARY STENT INTERVENTION (PCI-S) N/A 08/23/2012   Procedure: PERCUTANEOUS CORONARY STENT INTERVENTION (PCI-S);  Surgeon: Peter M Swaziland, MD;  Location: [facility] CATH LAB;  Service: Cardiovascular;  Laterality: N/A;  . Right Knee Arthroscopy     a. 2005    Social History   Tobacco Use  Smoking Status Never Smoker  Smokeless Tobacco Never Used    Social History   Substance and Sexual Activity  Alcohol Use No    Family History  Problem Relation Age of Onset  . Heart failure Father        died in his 73's.  . High Cholesterol Mother        alive @ 48.    Review of Systems: The review of systems is per the HPI.  All other systems were reviewed and are negative.  Physical Exam: BP 114/70   Pulse 61   Ht 5' 7.5" (1.715 m)   Wt 205 lb 9.6 oz (93.3 kg)   BMI 31.73 kg/m  GENERAL:  [name] HEENT:  PERRL, EOMI, sclera are clear. Oropharynx is clear. NECK:  No jugular venous distention, carotid upstroke brisk and symmetric, no bruits, no thyromegaly or adenopathy LUNGS:  Clear to auscultation bilaterally CHEST:   Unremarkable HEART:  RRR,  PMI not displaced or sustained,S1 and S2 within normal limits, no S3, no S4: no clicks, no rubs, no murmurs ABD:  Soft, nontender. BS +, no masses or bruits. No hepatomegaly, no splenomegaly EXT:  2 + pulses throughout, no edema, no cyanosis no clubbing SKIN:  Warm and dry.  No rashes NEURO:  Alert and oriented x 3. Cranial nerves II through XII intact. PSYCH:  Cognitively intact      LABORATORY DATA:   Lab Results  Component Value Date   WBC 5.6 08/13/2017   HGB 15.2 08/13/2017   HCT 44.1 08/13/2017   PLT 219 08/13/2017   GLUCOSE 102 (H) 08/13/2017   CHOL 94 (L) 08/13/2017   TRIG 61 08/13/2017   HDL 32 (L) 08/13/2017   LDLCALC 50 08/13/2017   ALT 28 08/13/2017   AST 19 08/13/2017   NA 142 08/13/2017   K 4.7 08/13/2017   CL 106 08/13/2017   CREATININE 0.76 08/13/2017   BUN 24 08/13/2017   CO2 23 08/13/2017   TSH 0.87 09/05/2013   INR 1.21 08/20/2012   HGBA1C 6.3 (H) 08/20/2012   Labs dated 03/06/16: cholesterol 107, triglycerides 70, LDL 62, HDL 31. CMET normal.  Ecg today shows NSR with normal Ecg. I have personally reviewed and interpreted this study.   ETT 08/19/17: Study Highlights    There was less than 0.44mm of horizontal to upsloping ST segment depression in the lateral precordial leads.  SBP dropped from Stage 2 to Stage 3.  Excellent exercise capacity. The patient reached 12.2 mets and 96% of MPHR with no CP.  This is a low risk study.    Assessment / Plan: 1. CAD s/p anterior STEMI in January 2014 with DES of the LAD and later of the PDA. Normal myoview in March 2015. Normal ETT March 2016 and January 2019. He is still asymptomatic. Recommend DAPT indefinitely since he is tolerating it well without bleeding or bruising. Follow up in one year.  2. HTN - well controlled on low dose metoprolol  3. HLD - continue high dose statin. Focus on lifestyle modification and weight loss.   Follow up in one year.

## 2018-08-19 ENCOUNTER — Encounter: Payer: Self-pay | Admitting: Cardiology

## 2018-08-19 ENCOUNTER — Ambulatory Visit (INDEPENDENT_AMBULATORY_CARE_PROVIDER_SITE_OTHER): Payer: BLUE CROSS/BLUE SHIELD | Admitting: Cardiology

## 2018-08-19 VITALS — BP 114/70 | HR 61 | Ht 67.5 in | Wt 205.6 lb

## 2018-08-19 DIAGNOSIS — I2510 Atherosclerotic heart disease of native coronary artery without angina pectoris: Secondary | ICD-10-CM

## 2018-08-19 DIAGNOSIS — E7800 Pure hypercholesterolemia, unspecified: Secondary | ICD-10-CM

## 2018-08-20 ENCOUNTER — Other Ambulatory Visit: Payer: Self-pay

## 2018-08-20 DIAGNOSIS — I2510 Atherosclerotic heart disease of native coronary artery without angina pectoris: Secondary | ICD-10-CM

## 2018-08-20 MED ORDER — NITROGLYCERIN 0.4 MG SL SUBL: 0.4000 mg | SUBLINGUAL_TABLET | SUBLINGUAL | 0 refills | Status: DC | PRN

## 2018-08-20 MED ORDER — METOPROLOL SUCCINATE ER 25 MG PO TB24: 25.0000 mg | ORAL_TABLET | Freq: Every day | ORAL | 0 refills | Status: DC

## 2018-08-20 MED ORDER — ATORVASTATIN CALCIUM 80 MG PO TABS: ORAL_TABLET | ORAL | 0 refills | Status: DC

## 2018-08-20 MED ORDER — CLOPIDOGREL BISULFATE 75 MG PO TABS: 75.0000 mg | ORAL_TABLET | Freq: Every day | ORAL | 0 refills | Status: DC

## 2018-08-26 DIAGNOSIS — E291 Testicular hypofunction: Secondary | ICD-10-CM | POA: Diagnosis not present

## 2018-08-26 DIAGNOSIS — E782 Mixed hyperlipidemia: Secondary | ICD-10-CM | POA: Diagnosis not present

## 2018-08-26 DIAGNOSIS — Z72820 Sleep deprivation: Secondary | ICD-10-CM | POA: Diagnosis not present

## 2018-08-26 DIAGNOSIS — R7303 Prediabetes: Secondary | ICD-10-CM | POA: Diagnosis not present

## 2018-09-01 DIAGNOSIS — E559 Vitamin D deficiency, unspecified: Secondary | ICD-10-CM | POA: Diagnosis not present

## 2018-09-01 DIAGNOSIS — Z6831 Body mass index (BMI) 31.0-31.9, adult: Secondary | ICD-10-CM | POA: Diagnosis not present

## 2018-09-10 ENCOUNTER — Other Ambulatory Visit: Payer: Self-pay | Admitting: Nurse Practitioner

## 2018-09-15 DIAGNOSIS — I10 Essential (primary) hypertension: Secondary | ICD-10-CM | POA: Diagnosis not present

## 2018-09-15 DIAGNOSIS — Z6830 Body mass index (BMI) 30.0-30.9, adult: Secondary | ICD-10-CM | POA: Diagnosis not present

## 2018-10-23 ENCOUNTER — Other Ambulatory Visit: Payer: Self-pay | Admitting: Cardiology

## 2019-01-12 DIAGNOSIS — G4733 Obstructive sleep apnea (adult) (pediatric): Secondary | ICD-10-CM | POA: Diagnosis not present

## 2019-01-12 DIAGNOSIS — Z9989 Dependence on other enabling machines and devices: Secondary | ICD-10-CM | POA: Diagnosis not present

## 2019-01-21 ENCOUNTER — Other Ambulatory Visit: Payer: Self-pay | Admitting: Cardiology

## 2019-01-29 DIAGNOSIS — Z20828 Contact with and (suspected) exposure to other viral communicable diseases: Secondary | ICD-10-CM | POA: Diagnosis not present

## 2019-03-24 DIAGNOSIS — H40013 Open angle with borderline findings, low risk, bilateral: Secondary | ICD-10-CM | POA: Diagnosis not present

## 2019-03-24 DIAGNOSIS — H25813 Combined forms of age-related cataract, bilateral: Secondary | ICD-10-CM | POA: Diagnosis not present

## 2019-08-15 DIAGNOSIS — E559 Vitamin D deficiency, unspecified: Secondary | ICD-10-CM | POA: Diagnosis not present

## 2019-08-15 DIAGNOSIS — Z0000 Encounter for general adult medical examination without abnormal findings: Secondary | ICD-10-CM | POA: Diagnosis not present

## 2019-08-15 DIAGNOSIS — Z125 Encounter for screening for malignant neoplasm of prostate: Secondary | ICD-10-CM | POA: Diagnosis not present

## 2019-08-15 DIAGNOSIS — E7800 Pure hypercholesterolemia, unspecified: Secondary | ICD-10-CM | POA: Diagnosis not present

## 2019-08-30 NOTE — Progress Notes (Signed)
David Pena Date of Birth: [birth year] Medical Record #[id number]  History of Present Illness: David Pena is seen back today for follow up CAD.  He presented in January of 2014 with anterior STEMI and emergent stenting of the proximal LAD with a 3.0 x 50mm Xience followed by a staged PCI of the PDA with a 2.25 x 52mm Promus. EF was normal. Other issues include HLD, HTN and past DVT following knee surgery back in 2005. He had a normal Myoview in March  2015 with EF 65%. 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 Repeat ETT in January 2019 was also normal.   On follow up today he is doing well. Denies any chest pain, dyspnea or palpitations. Because of Covid has not been following his [employer] as much and has gained weight. He has started exercising more over the past month.       Current Outpatient Medications  Medication Sig Dispense Refill  . aspirin EC 81 MG EC tablet Take 1 tablet (81 mg total) by mouth daily.    Marland Kitchen atorvastatin (LIPITOR) 80 MG tablet TAKE 1 TABLET BY MOUTH DAILY AT 6:00PM 90 tablet 3  . clopidogrel (PLAVIX) 75 MG tablet TAKE ONE TABLET BY MOUTH ONE TIME DAILY  90 tablet 3  . co-enzyme Q-10 30 MG capsule Take 30 mg by mouth daily.    . metoprolol succinate (TOPROL-XL) 25 MG 24 hr tablet TAKE 1 TABLET BY MOUTH ONCE DAILY 90 tablet 3  . nitroGLYCERIN (NITROSTAT) 0.4 MG SL tablet Place 1 tablet (0.4 mg total) under the tongue every 5 (five) minutes x 3 doses as needed for chest pain. 25 tablet 0  . zolpidem (AMBIEN) 5 MG tablet Take 5 mg by mouth at bedtime as needed for sleep.     No current facility-administered medications for this visit.    Allergies  Allergen Reactions  . Dilaudid [Hydromorphone] Other (See Comments)    Night sweats and terrors    Past Medical History:  Diagnosis Date  . CAD (coronary artery disease)    a. 08/2012 Ant STEMI/Cath/PCI: LM nl, LAD 100 (3.0x33  Xience Expedition DES), LCX 30p, RCA 56m, PDA 90 (staged PCI w/ 2.25x12 Promus DES on 1/20), EF 55%;  b. 08/2012 Echo: 55-60%, mild LVH, no rwma.  . History of DVT of lower extremity    a. RLE following knee surgery in 2005  . Hypertension   . Old MI (myocardial infarction)   . Osteoarthritis    a. s/p arthroscopic surgery - R knee  . PONV (postoperative nausea and vomiting)   . PONV (postoperative nausea and vomiting)   . Sleep apnea    on CPAP    Past Surgical History:  Procedure Laterality Date  . LEFT HEART CATHETERIZATION WITH CORONARY ANGIOGRAM N/A 08/20/2012   Procedure: LEFT HEART CATHETERIZATION WITH CORONARY ANGIOGRAM;  Surgeon: Laurey Morale, MD;  Location: [facility] CATH LAB;  Service: Cardiovascular;  Laterality: N/A;  . PERCUTANEOUS CORONARY STENT INTERVENTION (PCI-S) N/A 08/23/2012   Procedure: PERCUTANEOUS CORONARY STENT INTERVENTION (PCI-S);  Surgeon: Marquis Diles M Swaziland, MD;  Location: [facility] CATH LAB;  Service: Cardiovascular;  Laterality: N/A;  . Right Knee Arthroscopy     a. 2005    Social History   Tobacco Use  Smoking Status Never Smoker  Smokeless Tobacco Never Used    Social History   Substance and Sexual Activity  Alcohol  Use No    Family History  Problem Relation Age of Onset  . Heart failure Father        died in his 65's.  . High Cholesterol Mother        alive @ 101.    Review of Systems: The review of systems is per the HPI.  All other systems were reviewed and are negative.  Physical Exam: BP 132/90 (BP Location: Left Arm, Patient Position: Sitting, Cuff Size: Normal)   Pulse 74   Temp (!) 97.2 F (36.2 C)   Ht 5' 7.5" (1.715 m)   Wt 206 lb (93.4 kg)   BMI 31.79 kg/m  GENERAL:  [name] HEENT:  PERRL, EOMI, sclera are clear. Oropharynx is clear. NECK:  No jugular venous distention, carotid upstroke brisk and symmetric, no bruits, no thyromegaly or adenopathy LUNGS:  Clear to auscultation bilaterally CHEST:   Unremarkable HEART:  RRR,  PMI not displaced or sustained,S1 and S2 within normal limits, no S3, no S4: no clicks, no rubs, no murmurs ABD:  Soft, nontender. BS +, no masses or bruits. No hepatomegaly, no splenomegaly EXT:  2 + pulses throughout, no edema, no cyanosis no clubbing SKIN:  Warm and dry.  No rashes NEURO:  Alert and oriented x 3. Cranial nerves II through XII intact. PSYCH:  Cognitively intact      LABORATORY DATA:   Lab Results  Component Value Date   WBC 5.6 08/13/2017   HGB 15.2 08/13/2017   HCT 44.1 08/13/2017   PLT 219 08/13/2017   GLUCOSE 102 (H) 08/13/2017   CHOL 94 (L) 08/13/2017   TRIG 61 08/13/2017   HDL 32 (L) 08/13/2017   LDLCALC 50 08/13/2017   ALT 28 08/13/2017   AST 19 08/13/2017   NA 142 08/13/2017   K 4.7 08/13/2017   CL 106 08/13/2017   CREATININE 0.76 08/13/2017   BUN 24 08/13/2017   CO2 23 08/13/2017   TSH 0.87 09/05/2013   INR 1.21 08/20/2012   HGBA1C 6.3 (H) 08/20/2012   Labs dated 03/06/16: cholesterol 107, triglycerides 70, LDL 62, HDL 31. CMET normal. Dated 08/15/19: cholesterol 109, triglycerides 90, HDL 30, LDL 61. CBC and CMET normal   Ecg today shows NSR with HR 74. Nonspecific TWA.. I have personally reviewed and interpreted this study.   ETT 08/19/17: Study Highlights    There was less than 0.69mm of horizontal to upsloping ST segment depression in the lateral precordial leads.  SBP dropped 17mmHg from Stage 2 to Stage 3.  Excellent exercise capacity. The patient reached 12.2 mets and 96% of MPHR with no CP.  This is a low risk study.    Assessment / Plan: 1. CAD s/p anterior STEMI in January 2014 with DES of the LAD and later of the PDA. Normal myoview in March 2015. Normal ETT March 2016 and January 2019. He is still asymptomatic. Recommend DAPT indefinitely since he is tolerating it well without bleeding or bruising. Follow up in one year.  2. HTN - well controlled on low dose metoprolol  3. HLD - continue high  dose statin. More attention to lifestyle modification and weight loss.   Follow up in one year.

## 2019-09-05 ENCOUNTER — Other Ambulatory Visit: Payer: Self-pay

## 2019-09-05 ENCOUNTER — Encounter: Payer: Self-pay | Admitting: Cardiology

## 2019-09-05 ENCOUNTER — Ambulatory Visit (INDEPENDENT_AMBULATORY_CARE_PROVIDER_SITE_OTHER): Payer: BC Managed Care – PPO | Admitting: Cardiology

## 2019-09-05 VITALS — BP 132/90 | HR 74 | Temp 97.2°F | Ht 67.5 in | Wt 206.0 lb

## 2019-09-05 DIAGNOSIS — R0602 Shortness of breath: Secondary | ICD-10-CM | POA: Diagnosis not present

## 2019-09-05 DIAGNOSIS — I2510 Atherosclerotic heart disease of native coronary artery without angina pectoris: Secondary | ICD-10-CM

## 2019-09-05 DIAGNOSIS — I252 Old myocardial infarction: Secondary | ICD-10-CM

## 2019-09-05 DIAGNOSIS — E7800 Pure hypercholesterolemia, unspecified: Secondary | ICD-10-CM | POA: Diagnosis not present

## 2019-11-11 IMAGING — US US EXTREM LOW VENOUS*R*
1 series · 13 of 24 positions shown · non-contrast
Comparison: None.

CLINICAL DATA: Right calf pain for 2 days



[Series 1: us extrem low venous*right* · 0.08mm/px · 13 of 33 slices shown]
[im 1/33]
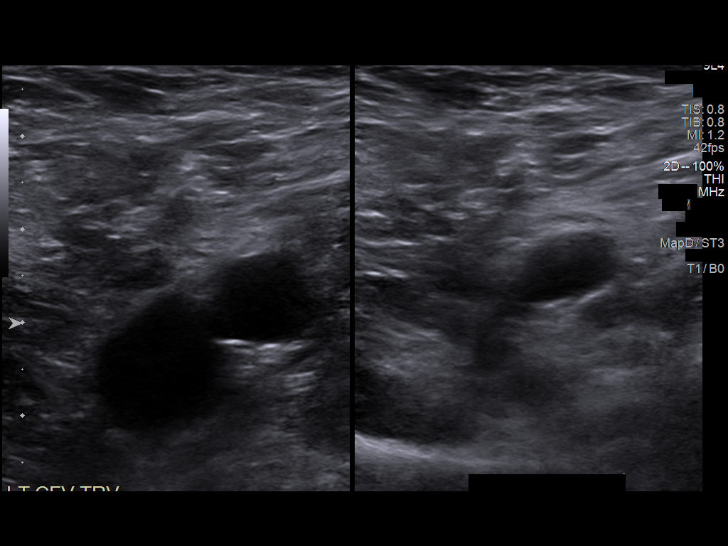
[im 3/33]
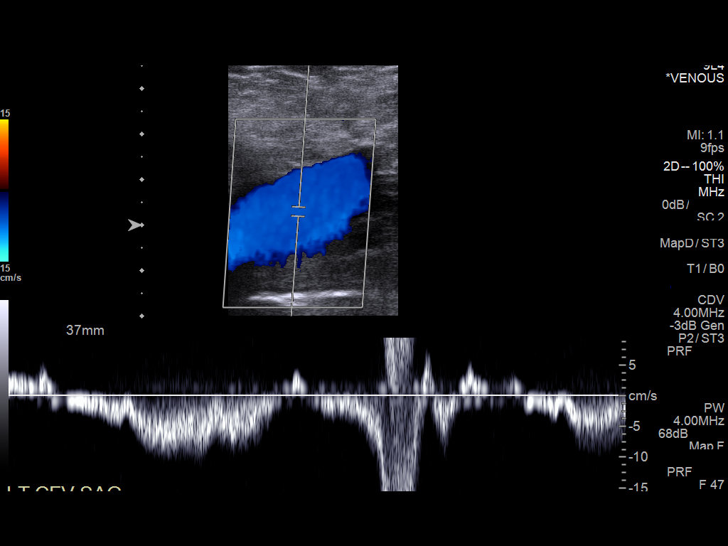
[im 6/33]
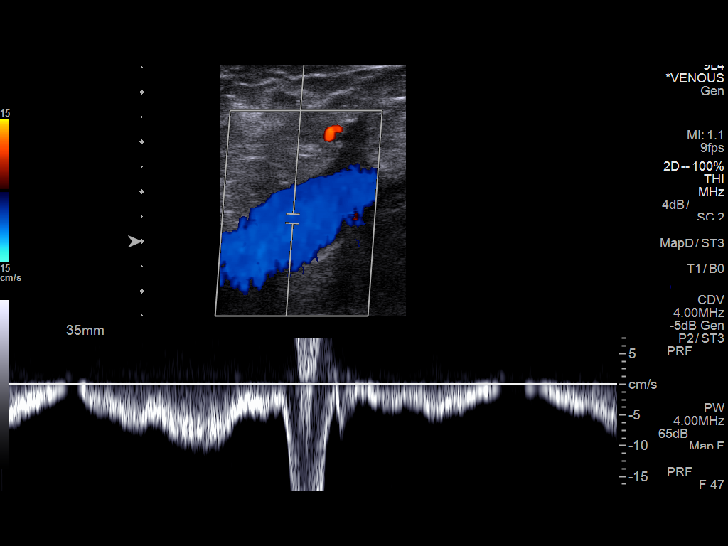
[im 9/33]
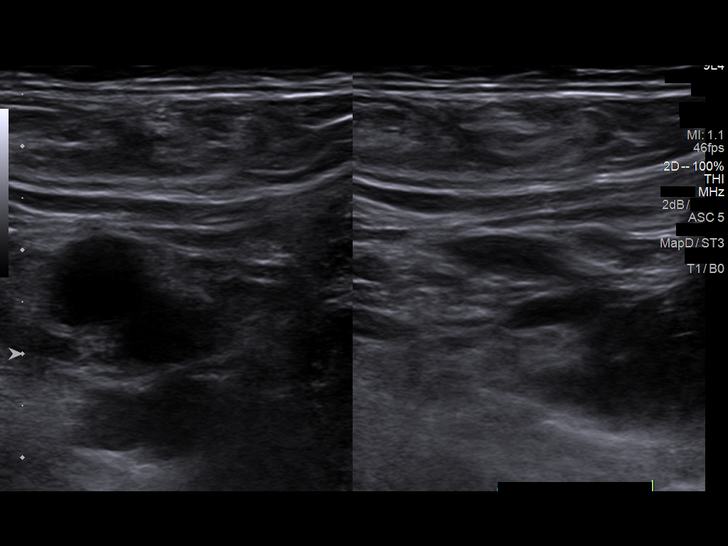
[im 12/33]
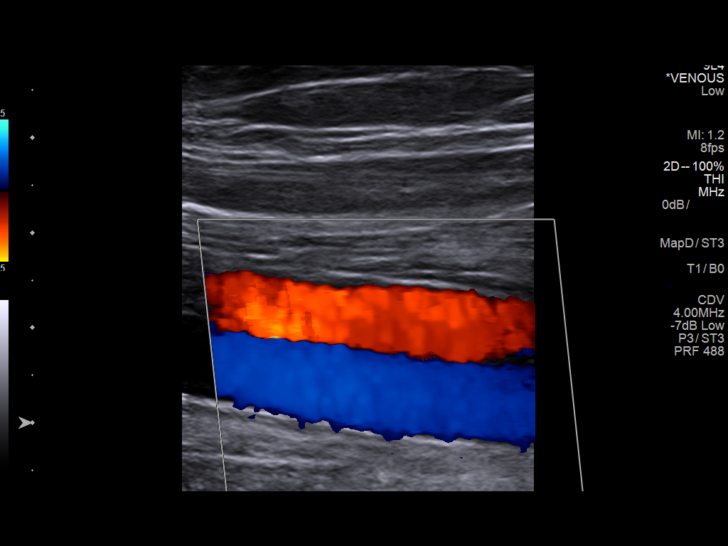
[im 14/33]
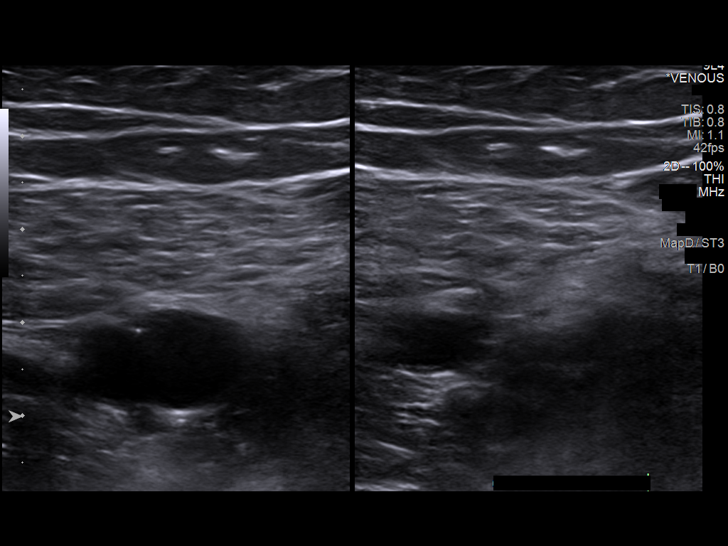
[im 17/33]
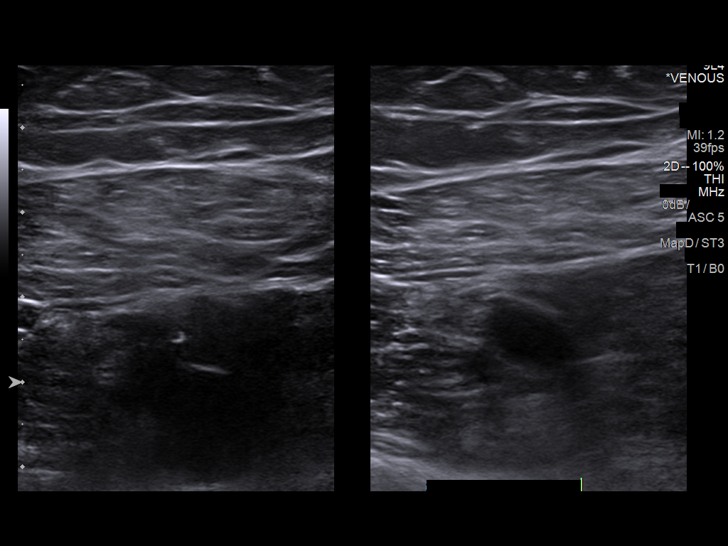
[im 19/33]
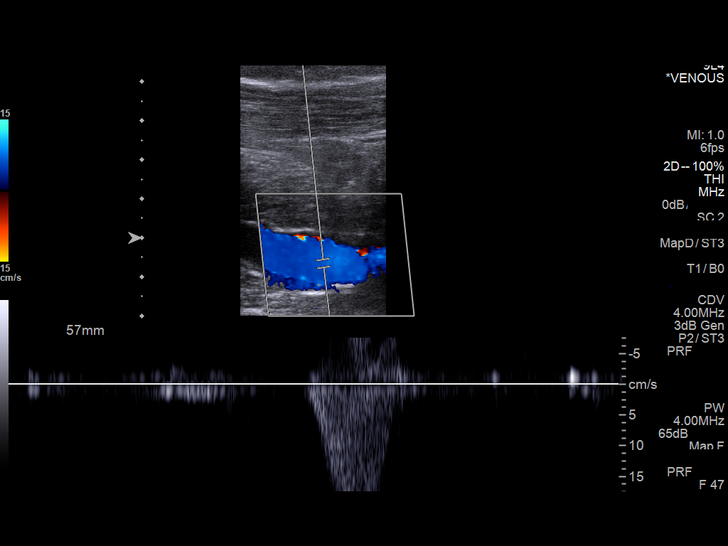
[im 21/33]
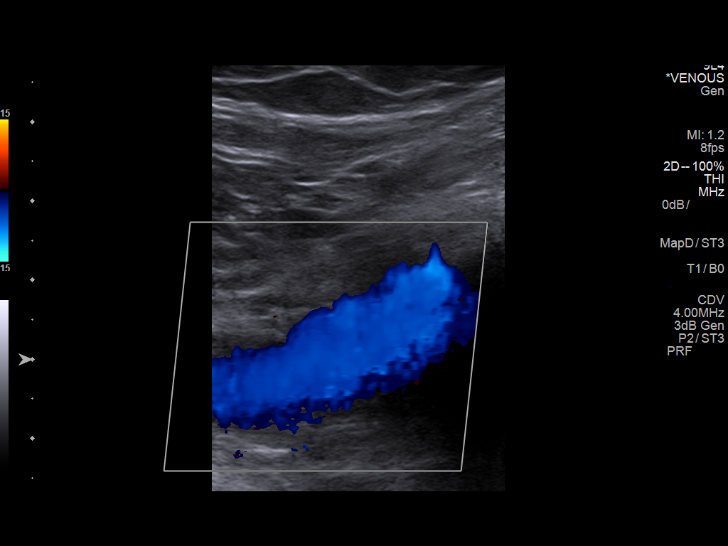
[im 24/33]
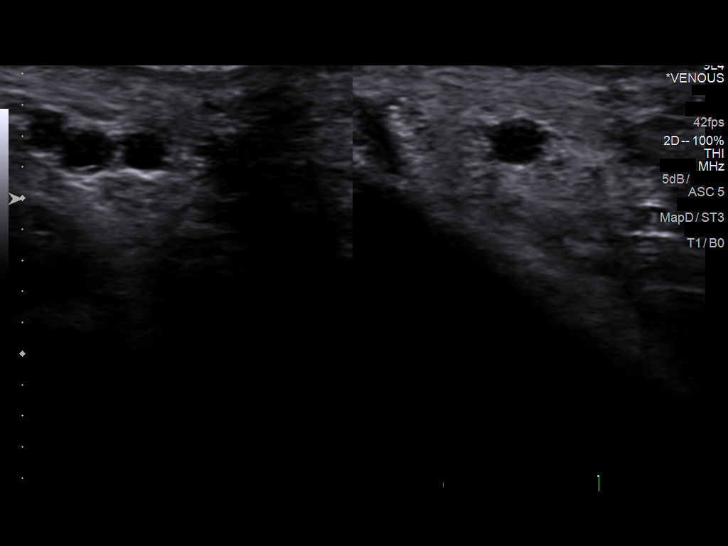
[im 27/33]
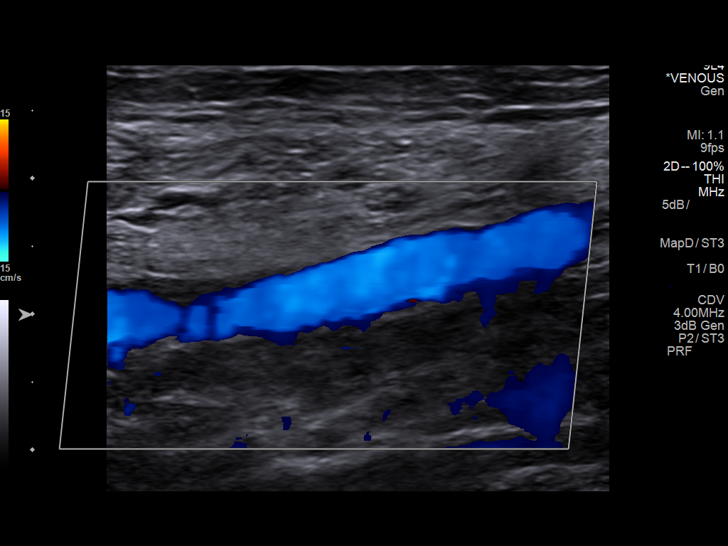
[im 30/33]
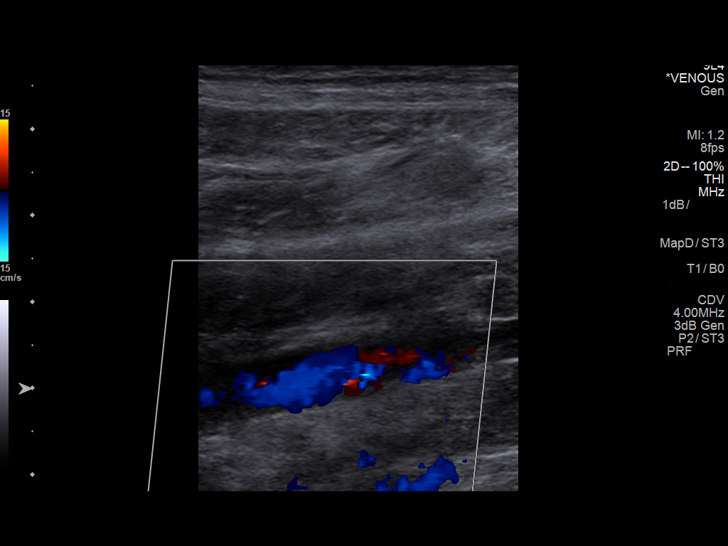
[im 33/33]
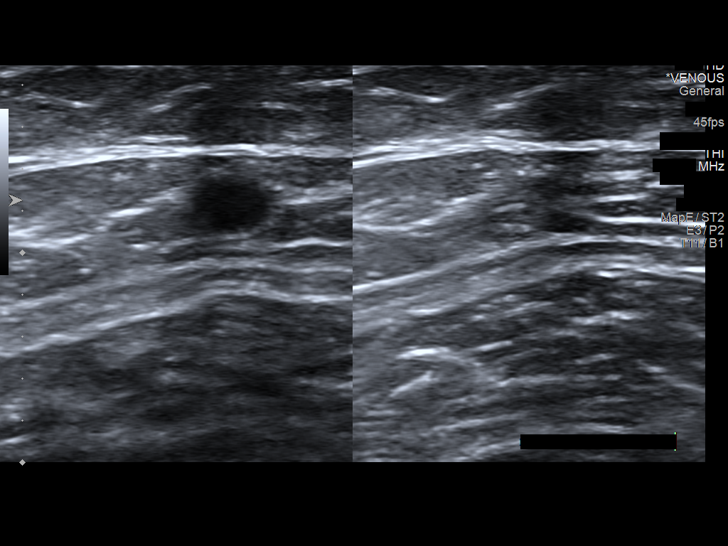

[13 of 24 positions shown; findings below may reference images not displayed]

FINDINGS: Contralateral Common Femoral Vein: Respiratory phasicity is normal
and symmetric with the symptomatic side. No evidence of thrombus.
Normal compressibility.

Common Femoral Vein: No evidence of thrombus. Normal
compressibility, respiratory phasicity and response to augmentation.

Saphenofemoral Junction: No evidence of thrombus. Normal
compressibility and flow on color Doppler imaging.

Profunda Femoral Vein: No evidence of thrombus. Normal
compressibility and flow on color Doppler imaging.

Femoral Vein: No evidence of thrombus. Normal compressibility,
respiratory phasicity and response to augmentation.

Popliteal Vein: No evidence of thrombus. Normal compressibility,
respiratory phasicity and response to augmentation.

Calf Veins: No evidence of thrombus. Normal compressibility and flow
on color Doppler imaging.

Superficial Great Saphenous Vein: No evidence of thrombus. Normal
compressibility.

Venous Reflux:  None.

Other Findings:  None.
IMPRESSION: No evidence of deep venous thrombosis.

## 2019-11-22 ENCOUNTER — Other Ambulatory Visit: Payer: Self-pay | Admitting: Cardiology

## 2019-12-13 DIAGNOSIS — Z9989 Dependence on other enabling machines and devices: Secondary | ICD-10-CM | POA: Diagnosis not present

## 2019-12-13 DIAGNOSIS — G4733 Obstructive sleep apnea (adult) (pediatric): Secondary | ICD-10-CM | POA: Diagnosis not present

## 2020-03-20 ENCOUNTER — Ambulatory Visit: Payer: Self-pay

## 2020-03-30 ENCOUNTER — Other Ambulatory Visit: Payer: Self-pay | Admitting: Cardiology

## 2020-04-04 DIAGNOSIS — H524 Presbyopia: Secondary | ICD-10-CM | POA: Diagnosis not present

## 2020-04-04 DIAGNOSIS — H40013 Open angle with borderline findings, low risk, bilateral: Secondary | ICD-10-CM | POA: Diagnosis not present

## 2020-04-04 DIAGNOSIS — H25813 Combined forms of age-related cataract, bilateral: Secondary | ICD-10-CM | POA: Diagnosis not present

## 2020-07-16 ENCOUNTER — Other Ambulatory Visit: Payer: Self-pay | Admitting: Cardiology

## 2020-08-10 ENCOUNTER — Other Ambulatory Visit: Payer: Self-pay | Admitting: Cardiology

## 2020-08-25 ENCOUNTER — Other Ambulatory Visit: Payer: Self-pay | Admitting: Cardiology

## 2020-10-10 ENCOUNTER — Other Ambulatory Visit: Payer: Self-pay | Admitting: Cardiology

## 2020-10-26 NOTE — Progress Notes (Signed)
Merrel Crabbe Date of Birth: 1955/09/21 Medical Record #[id number]  History of Present Illness: David Pena is seen back today for follow up CAD.  He presented in January of 2014 with anterior STEMI and emergent stenting of the proximal LAD with a 3.0 x 39mm Xience followed by a staged PCI of the PDA with a 2.25 x 54mm Promus. EF was normal. Other issues include HLD, HTN and past DVT following knee surgery back in 2005. He had a normal Myoview in March  2015 with EF 65%. 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 Repeat ETT in January 2019 was also normal.   On follow up today he is doing well. Denies any  dyspnea or palpitations. Rare fleeting sharp chest pain.  He has gained 15 lbs this year and notes he is the heaviest he ever has been. Sedentary.      Current Outpatient Medications  Medication Sig Dispense Refill  . aspirin EC 81 MG EC tablet Take 1 tablet (81 mg total) by mouth daily.    Marland Kitchen atorvastatin (LIPITOR) 80 MG tablet TAKE ONE TABLET BY MOUTH ONE TIME DAILY AT 6PM 90 tablet 1  . clopidogrel (PLAVIX) 75 MG tablet TAKE ONE TABLET BY MOUTH ONE TIME DAILY 90 tablet 3  . co-enzyme Q-10 30 MG capsule Take 30 mg by mouth daily.    . metoprolol succinate (TOPROL-XL) 25 MG 24 hr tablet Take 1 tablet (25 mg total) by mouth daily. Please keep upcoming appt for future refills. 1st attempt 30 tablet 0  . nitroGLYCERIN (NITROSTAT) 0.4 MG SL tablet Place 1 tablet (0.4 mg total) under the tongue every 5 (five) minutes x 3 doses as needed for chest pain. 25 tablet 0  . zolpidem (AMBIEN) 5 MG tablet Take 5 mg by mouth at bedtime as needed for sleep.     No current facility-administered medications for this visit.    Allergies  Allergen Reactions  . Dilaudid [Hydromorphone] Other (See Comments)    Night sweats and terrors    Past Medical History:  Diagnosis Date  . CAD (coronary artery disease)    a. 08/2012  Ant STEMI/Cath/PCI: LM nl, LAD 100 (3.0x33 Xience Expedition DES), LCX 30p, RCA 29m, PDA 90 (staged PCI w/ 2.25x12 Promus DES on 1/20), EF 55%;  b. 08/2012 Echo: 55-60%, mild LVH, no rwma.  . History of DVT of lower extremity    a. RLE following knee surgery in 2005  . Hypertension   . Old MI (myocardial infarction)   . Osteoarthritis    a. s/p arthroscopic surgery - R knee  . PONV (postoperative nausea and vomiting)   . PONV (postoperative nausea and vomiting)   . Sleep apnea    on CPAP    Past Surgical History:  Procedure Laterality Date  . LEFT HEART CATHETERIZATION WITH CORONARY ANGIOGRAM N/A 08/20/2012   Procedure: LEFT HEART CATHETERIZATION WITH CORONARY ANGIOGRAM;  Surgeon: Laurey Morale, MD;  Location: [facility] CATH LAB;  Service: Cardiovascular;  Laterality: N/A;  . PERCUTANEOUS CORONARY STENT INTERVENTION (PCI-S) N/A 08/23/2012   Procedure: PERCUTANEOUS CORONARY STENT INTERVENTION (PCI-S);  Surgeon: Peter M Swaziland, MD;  Location: [facility] CATH LAB;  Service: Cardiovascular;  Laterality: N/A;  . Right Knee Arthroscopy     a. 2005    Social History   Tobacco Use  Smoking Status Never Smoker  Smokeless Tobacco Never Used    Social History  Substance and Sexual Activity  Alcohol Use No    Family History  Problem Relation Age of Onset  . Heart failure Father        died in his 19's.  . High Cholesterol Mother        alive @ 21.    Review of Systems: The review of systems is per the HPI.  All other systems were reviewed and are negative.  Physical Exam: BP 124/78 (BP Location: Left Arm, Patient Position: Sitting)   Pulse 65   Ht 5' 7.5" (1.715 m)   Wt 221 lb 3.2 oz (100.3 kg)   SpO2 96%   BMI 34.13 kg/m  GENERAL:  [name] HEENT:  PERRL, EOMI, sclera are clear. Oropharynx is clear. NECK:  No jugular venous distention, carotid upstroke brisk and symmetric, no bruits, no thyromegaly or adenopathy LUNGS:  Clear to auscultation  bilaterally CHEST:  Unremarkable HEART:  RRR,  PMI not displaced or sustained,S1 and S2 within normal limits, no S3, no S4: no clicks, no rubs, no murmurs ABD:  Soft, nontender. BS +, no masses or bruits. No hepatomegaly, no splenomegaly EXT:  2 + pulses throughout, no edema, no cyanosis no clubbing SKIN:  Warm and dry.  No rashes NEURO:  Alert and oriented x 3. Cranial nerves II through XII intact. PSYCH:  Cognitively intact      LABORATORY DATA:   Lab Results  Component Value Date   WBC 5.6 08/13/2017   HGB 15.2 08/13/2017   HCT 44.1 08/13/2017   PLT 219 08/13/2017   GLUCOSE 102 (H) 08/13/2017   CHOL 94 (L) 08/13/2017   TRIG 61 08/13/2017   HDL 32 (L) 08/13/2017   LDLCALC 50 08/13/2017   ALT 28 08/13/2017   AST 19 08/13/2017   NA 142 08/13/2017   K 4.7 08/13/2017   CL 106 08/13/2017   CREATININE 0.76 08/13/2017   BUN 24 08/13/2017   CO2 23 08/13/2017   TSH 0.87 09/05/2013   INR 1.21 08/20/2012   HGBA1C 6.3 (H) 08/20/2012   Labs dated 03/06/16: cholesterol 107, triglycerides 70, LDL 62, HDL 31. CMET normal. Dated 08/15/19: cholesterol 109, triglycerides 90, HDL 30, LDL 61. CBC and CMET normal   Ecg today shows NSR with HR 65. Normal. I have personally reviewed and interpreted this study.   ETT 08/19/17: Study Highlights    There was less than 0.30mm of horizontal to upsloping ST segment depression in the lateral precordial leads.  SBP dropped from Stage 2 to Stage 3.  Excellent exercise capacity. The patient reached 12.2 mets and 96% of MPHR with no CP.  This is a low risk study.    Assessment / Plan: 1. CAD s/p anterior STEMI in January 2014 with DES of the LAD and later of the PDA. Normal myoview in March 2015. Normal ETT March 2016 and January 2019. He is still asymptomatic. Recommend DAPT indefinitely since he is tolerating it well without bleeding or bruising. Follow up in one year.  2. HTN - well controlled on low dose metoprolol  3. HLD -  continue high dose statin. More attention to lifestyle modification and weight loss.  Will update fasting lab work today  4. Obesity. Needs to increase his aerobic activity and focus on weight loss with low carb diet.   Follow up in one year.

## 2020-10-29 ENCOUNTER — Encounter: Payer: Self-pay | Admitting: Cardiology

## 2020-10-29 ENCOUNTER — Other Ambulatory Visit: Payer: Self-pay

## 2020-10-29 ENCOUNTER — Ambulatory Visit (INDEPENDENT_AMBULATORY_CARE_PROVIDER_SITE_OTHER): Payer: BC Managed Care – PPO | Admitting: Cardiology

## 2020-10-29 VITALS — BP 124/78 | HR 65 | Ht 67.5 in | Wt 221.2 lb

## 2020-10-29 DIAGNOSIS — I10 Essential (primary) hypertension: Secondary | ICD-10-CM | POA: Diagnosis not present

## 2020-10-29 DIAGNOSIS — I2510 Atherosclerotic heart disease of native coronary artery without angina pectoris: Secondary | ICD-10-CM

## 2020-10-29 DIAGNOSIS — I252 Old myocardial infarction: Secondary | ICD-10-CM

## 2020-10-29 DIAGNOSIS — E7800 Pure hypercholesterolemia, unspecified: Secondary | ICD-10-CM | POA: Diagnosis not present

## 2020-10-29 LAB — HEPATIC FUNCTION PANEL
AST: 25 IU/L (ref 0–40)
Bilirubin Total: 0.6 mg/dL (ref 0.0–1.2)
Bilirubin, Direct: 0.17 mg/dL (ref 0.00–0.40)

## 2020-10-29 LAB — CBC WITH DIFFERENTIAL/PLATELET
Hemoglobin: 15.2 g/dL (ref 13.0–17.7)
Immature Granulocytes: 1 %
MCH: 29.6 pg (ref 26.6–33.0)
Monocytes Absolute: 0.5 10*3/uL (ref 0.1–0.9)
Neutrophils Absolute: 3.5 10*3/uL (ref 1.4–7.0)
Neutrophils: 53 %

## 2020-10-29 LAB — BASIC METABOLIC PANEL
BUN/Creatinine Ratio: 20 (ref 10–24)
CO2: 23 mmol/L (ref 20–29)
Glucose: 120 mg/dL — ABNORMAL HIGH (ref 65–99)

## 2020-10-29 LAB — LIPID PANEL
Chol/HDL Ratio: 3.5 ratio (ref 0.0–5.0)
HDL: 31 mg/dL — ABNORMAL LOW (ref >39)
LDL Chol Calc (NIH): 59 mg/dL (ref 0–99)

## 2020-10-29 MED ORDER — NITROGLYCERIN 0.4 MG SL SUBL: 0.4000 mg | SUBLINGUAL_TABLET | SUBLINGUAL | 0 refills | Status: DC | PRN

## 2020-10-29 NOTE — Patient Instructions (Signed)
Increase your aerobic activity and lose weight.  We will check lab work today.  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

## 2020-10-30 LAB — CBC WITH DIFFERENTIAL/PLATELET
Basophils Absolute: 0 10*3/uL (ref 0.0–0.2)
Basos: 1 %
EOS (ABSOLUTE): 0.3 10*3/uL (ref 0.0–0.4)
Eos: 5 %
Hematocrit: 45.5 % (ref 37.5–51.0)
Immature Grans (Abs): 0 10*3/uL (ref 0.0–0.1)
Lymphocytes Absolute: 2.1 10*3/uL (ref 0.7–3.1)
Lymphs: 32 %
MCHC: 33.4 g/dL (ref 31.5–35.7)
MCV: 89 fL (ref 79–97)
Monocytes: 8 %
Platelets: 251 10*3/uL (ref 150–450)
RBC: 5.14 x10E6/uL (ref 4.14–5.80)
RDW: 12.3 % (ref 11.6–15.4)
WBC: 6.5 10*3/uL (ref 3.4–10.8)

## 2020-10-30 LAB — LIPID PANEL
Cholesterol, Total: 108 mg/dL (ref 100–199)
Triglycerides: 93 mg/dL (ref 0–149)
VLDL Cholesterol Cal: 18 mg/dL (ref 5–40)

## 2020-10-30 LAB — HEPATIC FUNCTION PANEL
ALT: 49 IU/L — ABNORMAL HIGH (ref 0–44)
Albumin: 4.4 g/dL (ref 3.8–4.8)
Alkaline Phosphatase: 71 IU/L (ref 44–121)
Total Protein: 6.7 g/dL (ref 6.0–8.5)

## 2020-10-30 LAB — BASIC METABOLIC PANEL
BUN: 17 mg/dL (ref 8–27)
Calcium: 9.4 mg/dL (ref 8.6–10.2)
Chloride: 104 mmol/L (ref 96–106)
Creatinine, Ser: 0.86 mg/dL (ref 0.76–1.27)
Potassium: 4.7 mmol/L (ref 3.5–5.2)
Sodium: 141 mmol/L (ref 134–144)
eGFR: 97 mL/min/{1.73_m2} (ref >59)

## 2020-10-30 LAB — HEMOGLOBIN A1C
Est. average glucose Bld gHb Est-mCnc: 140 mg/dL
Hgb A1c MFr Bld: 6.5 % — ABNORMAL HIGH (ref 4.8–5.6)

## 2020-12-03 ENCOUNTER — Other Ambulatory Visit: Payer: Self-pay | Admitting: Cardiology

## 2021-02-01 ENCOUNTER — Other Ambulatory Visit: Payer: Self-pay | Admitting: Cardiology

## 2021-03-07 DIAGNOSIS — Z7901 Long term (current) use of anticoagulants: Secondary | ICD-10-CM | POA: Diagnosis not present

## 2021-03-07 DIAGNOSIS — Z86010 Personal history of colonic polyps: Secondary | ICD-10-CM | POA: Diagnosis not present

## 2021-03-12 ENCOUNTER — Telehealth: Payer: Self-pay | Admitting: *Deleted

## 2021-03-12 DIAGNOSIS — G4733 Obstructive sleep apnea (adult) (pediatric): Secondary | ICD-10-CM | POA: Diagnosis not present

## 2021-03-12 NOTE — Telephone Encounter (Signed)
   [facility] Pre-operative Risk Assessment    Patient Name: Opie Maclaughlin  DOB: Jul 23, 1956 MRN: [id number]  HEARTCARE STAFF:  - IMPORTANT!!!!!! Under Visit Info/Reason for Call, type in Other and utilize the format Clearance MM/DD/YY or Clearance TBD. Do not use dashes or single digits. - Please review there is not already an duplicate clearance open for this procedure. - If request is for dental extraction, please clarify the # of teeth to be extracted. - If the patient is currently at the dentist's office, call Pre-Op Callback Staff (MA/nurse) to input urgent request.  - If the patient is not currently in the dentist office, please route to the Pre-Op pool.  Request for surgical clearance:  What type of surgery is being performed? Colonoscopy  When is this surgery scheduled? 05/16/21  What type of clearance is required (medical clearance vs. Pharmacy clearance to hold med vs. Both)? Both  Are there any medications that need to be held prior to surgery and how long? Clopidogrel  Practice name and name of physician performing surgery? [facility]; Dr Paulita Fujita  What is the office phone number? [phone]   7.   What is the office fax number? [phone]  8.   Anesthesia type (None, local, MAC, general) ? Not listed   Juventino Slovak 03/12/2021, 4:45 PM  _________________________________________________________________   (provider comments below)

## 2021-03-12 NOTE — Telephone Encounter (Signed)
Left a message for the patient to call back and speak to the on-call preop APP of today.  Patient was last seen by Dr. Swaziland in March 2022.  He had a history of anterior STEMI in January 2014 and underwent DES to proximal LAD and a staged PCI of PDA.  Myoview in March 2015 was normal.  EKG in March 2016 was also normal.  Dr. Swaziland, as long as the patient does not have any recent chest pain, would you be okay with him hold Plavix for 5 days prior to GI procedure?

## 2021-03-13 NOTE — Telephone Encounter (Signed)
He may hold Plavix for 5 days prior to procedure  Fany Cavanaugh [place] MD, [facility]

## 2021-03-13 NOTE — Telephone Encounter (Signed)
   Primary Cardiologist: Peter Swaziland, MD  Chart reviewed as part of pre-operative protocol coverage. Given past medical history and time since last visit, based on ACC/AHA guidelines, David Pena would be at acceptable risk for the planned procedure without further cardiovascular testing.   His Plavix may be held for 5 days prior to his procedure.  Please resume as soon as hemostasis is achieved.  Patient was advised that if he develops new symptoms prior to surgery to contact our office to arrange a follow-up appointment.  He verbalized understanding.  I will route this recommendation to the requesting party via Epic fax function and remove from pre-op pool.  Please call with questions.  Thomasene Ripple. Houda Brau NP-C    03/13/2021, 1:58 PM [facility] [address] Office [phone] Fax [phone]

## 2021-03-13 NOTE — Telephone Encounter (Signed)
Contacted patient as part of preoperative protocol.  He is able to complete greater than 4 METS of physical activity and has no cardiac complaints at this time.  As soon as Dr. Swaziland replies about Plavix hold preoperative cardiac evaluation may be sent.  Thomasene Ripple. Worthington Cruzan NP-C    03/13/2021, 1:39 PM [facility] [address] Office [phone] Fax [phone]

## 2021-06-04 DIAGNOSIS — H2513 Age-related nuclear cataract, bilateral: Secondary | ICD-10-CM | POA: Diagnosis not present

## 2021-06-23 ENCOUNTER — Other Ambulatory Visit: Payer: Self-pay | Admitting: Cardiology

## 2021-09-19 ENCOUNTER — Other Ambulatory Visit: Payer: Self-pay | Admitting: Cardiology

## 2021-09-19 DIAGNOSIS — I2510 Atherosclerotic heart disease of native coronary artery without angina pectoris: Secondary | ICD-10-CM

## 2021-10-24 NOTE — Progress Notes (Signed)
? ?David Pena ?Date of Birth: 14-Dec-1955 ?Medical Record #[id number] ? ?History of Present Illness: ?David Pena is seen back today for follow up CAD.  He presented in January of 2014 with anterior STEMI and emergent stenting of the proximal LAD with a 3.0 x 64mm Xience followed by a staged PCI of the PDA with a 2.25 x 102mm Promus. EF was normal. Other issues include HLD, HTN and past DVT following knee surgery back in 2005. He had a normal Myoview in March  2015 with EF 65%. 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 Repeat ETT in January 2019 was also normal.  ? ?On follow up today he is doing well. Denies any dyspnea or palpitations. No chest pain.  He has done much better with his diet this year and has lost 21 lbs. No concentrated sweets. Is dealing with pain in his foot- followed by ortho.  ? ?  ?  ?Current Outpatient Medications  ?Medication Sig Dispense Refill  ? aspirin EC 81 MG EC tablet Take 1 tablet (81 mg total) by mouth daily.    ? atorvastatin (LIPITOR) 80 MG tablet TAKE ONE TABLET BY MOUTH ONE TIME DAILY AT 6:00PM 90 tablet 3  ? clopidogrel (PLAVIX) 75 MG tablet TAKE ONE TABLET BY MOUTH ONE TIME DAILY 90 tablet 3  ? co-enzyme Q-10 30 MG capsule Take 30 mg by mouth daily.    ? nitroGLYCERIN (NITROSTAT) 0.4 MG SL tablet place 1 tablet (0.4mg  total) under the tongue every 5 mins for 3 doses as needed for chest pain 25 tablet 7  ? zolpidem (AMBIEN) 5 MG tablet Take 5 mg by mouth at bedtime as needed for sleep.    ? metoprolol succinate (TOPROL-XL) 25 MG 24 hr tablet Take 1 tablet (25 mg total) by mouth daily. 90 tablet 3  ? ?No current facility-administered medications for this visit.  ? ? ?Allergies  ?Allergen Reactions  ? Dilaudid [Hydromorphone] Other (See Comments)  ?  Night sweats and terrors  ? ? ?Past Medical History:  ?Diagnosis Date  ? CAD (coronary artery disease)   ? a. 08/2012 Ant STEMI/Cath/PCI: LM nl, LAD  100 (3.0x33 Xience Expedition DES), LCX 30p, RCA 72m, PDA 90 (staged PCI w/ 2.25x12 Promus DES on 1/20), EF 55%;  b. 08/2012 Echo: 55-60%, mild LVH, no rwma.  ? History of DVT of lower extremity   ? a. RLE following knee surgery in 2005  ? Hypertension   ? Old MI (myocardial infarction)   ? Osteoarthritis   ? a. s/p arthroscopic surgery - R knee  ? PONV (postoperative nausea and vomiting)   ? PONV (postoperative nausea and vomiting)   ? Sleep apnea   ? on CPAP  ? ? ?Past Surgical History:  ?Procedure Laterality Date  ? LEFT HEART CATHETERIZATION WITH CORONARY ANGIOGRAM N/A 08/20/2012  ? Procedure: LEFT HEART CATHETERIZATION WITH CORONARY ANGIOGRAM;  Surgeon: Laurey Morale, MD;  Location: [facility] CATH LAB;  Service: Cardiovascular;  Laterality: N/A;  ? PERCUTANEOUS CORONARY STENT INTERVENTION (PCI-S) N/A 08/23/2012  ? Procedure: PERCUTANEOUS CORONARY STENT INTERVENTION (PCI-S);  Surgeon: Peggye Poon M Swaziland, MD;  Location: [facility] CATH LAB;  Service: Cardiovascular;  Laterality: N/A;  ? Right Knee Arthroscopy    ? a. 2005  ? ? ?Social History  ? ?Tobacco Use  ?Smoking Status Never  ?Smokeless Tobacco Never  ? ? ?Social History  ? ?Substance and Sexual  Activity  ?Alcohol Use No  ? ? ?Family History  ?Problem Relation Age of Onset  ? Heart failure Father   ?     died in his 95's.  ? High Cholesterol Mother   ?     alive @ 27.  ? ? ?Review of Systems: ?The review of systems is per the HPI.  All other systems were reviewed and are negative. ? ?Physical Exam: ?BP 120/80 (BP Location: Left Arm)   Pulse (!) 52   Ht 5' 7.5" (1.715 m)   Wt 200 lb 3.2 oz (90.8 kg)   SpO2 93%   BMI 30.89 kg/m?  ?GENERAL:  [name] ?HEENT:  PERRL, EOMI, sclera are clear. Oropharynx is clear. ?NECK:  No jugular venous distention, carotid upstroke brisk and symmetric, no bruits, no thyromegaly or adenopathy ?LUNGS:  Clear to auscultation bilaterally ?CHEST:  Unremarkable ?HEART:  RRR,  PMI not displaced or sustained,S1 and S2  within normal limits, no S3, no S4: no clicks, no rubs, no murmurs ?ABD:  Soft, nontender. BS +, no masses or bruits. No hepatomegaly, no splenomegaly ?EXT:  2 + pulses throughout, no edema, no cyanosis no clubbing ?SKIN:  Warm and dry.  No rashes ?NEURO:  Alert and oriented x 3. Cranial nerves II through XII intact. ?PSYCH:  Cognitively intact ? ? ? ? ? ?LABORATORY DATA:  ? ?Lab Results  ?Component Value Date  ? WBC 6.5 10/29/2020  ? HGB 15.2 10/29/2020  ? HCT 45.5 10/29/2020  ? PLT 251 10/29/2020  ? GLUCOSE 120 (H) 10/29/2020  ? CHOL 108 10/29/2020  ? TRIG 93 10/29/2020  ? HDL 31 (L) 10/29/2020  ? LDLCALC 59 10/29/2020  ? ALT 49 (H) 10/29/2020  ? AST 25 10/29/2020  ? NA 141 10/29/2020  ? K 4.7 10/29/2020  ? CL 104 10/29/2020  ? CREATININE 0.86 10/29/2020  ? BUN 17 10/29/2020  ? CO2 23 10/29/2020  ? TSH 0.87 09/05/2013  ? INR 1.21 08/20/2012  ? HGBA1C 6.5 (H) 10/29/2020  ? ?Labs dated 03/06/16: cholesterol 107, triglycerides 70, LDL 62, HDL 31. CMET normal. ?Dated 08/15/19: cholesterol 109, triglycerides 90, HDL 30, LDL 61. CBC and CMET normal ? ? ?Ecg today shows NSR with HR 52. Normal. I have personally reviewed and interpreted this study. ? ? ?ETT 08/19/17: Study Highlights  ? ?There was less than 0.83mm of horizontal to upsloping ST segment depression in the lateral precordial leads. ?SBP dropped from Stage 2 to Stage 3. ?Excellent exercise capacity. The patient reached 12.2 mets and 96% of MPHR with no CP. ?This is a low risk study.  ? ? ?Assessment / Plan: ?1. CAD s/p anterior STEMI in January 2014 with DES of the LAD and later of the PDA. Normal myoview in March 2015. Normal ETT March 2016 and January 2019. He is still asymptomatic. Recommend DAPT indefinitely since he is tolerating it well without bleeding or bruising. Follow up in one year. ? ?2. HTN - well controlled on low dose metoprolol ? ?3. HLD - continue high dose statin. Update labs today. Encouraged with dietary modification and weight  loss. ? ?4. Obesity.  ? ?Follow up in one year. ? ? ? ?

## 2021-10-25 ENCOUNTER — Encounter: Payer: Self-pay | Admitting: Cardiology

## 2021-10-29 ENCOUNTER — Ambulatory Visit (INDEPENDENT_AMBULATORY_CARE_PROVIDER_SITE_OTHER): Payer: BC Managed Care – PPO | Admitting: Cardiology

## 2021-10-29 ENCOUNTER — Other Ambulatory Visit: Payer: Self-pay

## 2021-10-29 ENCOUNTER — Encounter: Payer: Self-pay | Admitting: Cardiology

## 2021-10-29 VITALS — BP 120/80 | HR 52 | Ht 67.5 in | Wt 200.2 lb

## 2021-10-29 DIAGNOSIS — I10 Essential (primary) hypertension: Secondary | ICD-10-CM

## 2021-10-29 DIAGNOSIS — E7800 Pure hypercholesterolemia, unspecified: Secondary | ICD-10-CM

## 2021-10-29 DIAGNOSIS — I2510 Atherosclerotic heart disease of native coronary artery without angina pectoris: Secondary | ICD-10-CM | POA: Diagnosis not present

## 2021-10-29 LAB — CBC WITH DIFFERENTIAL/PLATELET
Basophils Absolute: 0 10*3/uL (ref 0.0–0.2)
Basos: 1 %
EOS (ABSOLUTE): 0.2 10*3/uL (ref 0.0–0.4)
Eos: 4 %
Hematocrit: 43.2 % (ref 37.5–51.0)
Hemoglobin: 14.9 g/dL (ref 13.0–17.7)
Immature Grans (Abs): 0 10*3/uL (ref 0.0–0.1)
Immature Granulocytes: 0 %
Lymphocytes Absolute: 2 10*3/uL (ref 0.7–3.1)
Lymphs: 35 %
MCH: 30.3 pg (ref 26.6–33.0)
MCHC: 34.5 g/dL (ref 31.5–35.7)
MCV: 88 fL (ref 79–97)
Monocytes Absolute: 0.4 10*3/uL (ref 0.1–0.9)
Monocytes: 7 %
Neutrophils Absolute: 3.1 10*3/uL (ref 1.4–7.0)
Neutrophils: 53 %
Platelets: 216 10*3/uL (ref 150–450)
RBC: 4.92 x10E6/uL (ref 4.14–5.80)
RDW: 12.5 % (ref 11.6–15.4)
WBC: 5.8 10*3/uL (ref 3.4–10.8)

## 2021-10-29 LAB — BASIC METABOLIC PANEL
BUN/Creatinine Ratio: 32 — ABNORMAL HIGH (ref 10–24)
BUN: 27 mg/dL (ref 8–27)
CO2: 27 mmol/L (ref 20–29)
Calcium: 9.8 mg/dL (ref 8.6–10.2)
Chloride: 105 mmol/L (ref 96–106)
Creatinine, Ser: 0.85 mg/dL (ref 0.76–1.27)
Glucose: 92 mg/dL (ref 70–99)
Potassium: 4.9 mmol/L (ref 3.5–5.2)
Sodium: 143 mmol/L (ref 134–144)
eGFR: 97 mL/min/{1.73_m2} (ref >59)

## 2021-10-29 LAB — LIPID PANEL
Chol/HDL Ratio: 3.1 ratio (ref 0.0–5.0)
Cholesterol, Total: 90 mg/dL — ABNORMAL LOW (ref 100–199)
HDL: 29 mg/dL — ABNORMAL LOW (ref >39)
LDL Chol Calc (NIH): 46 mg/dL (ref 0–99)
Triglycerides: 71 mg/dL (ref 0–149)
VLDL Cholesterol Cal: 15 mg/dL (ref 5–40)

## 2021-10-29 LAB — HEPATIC FUNCTION PANEL
ALT: 39 IU/L (ref 0–44)
AST: 25 IU/L (ref 0–40)
Albumin: 4.7 g/dL (ref 3.8–4.8)
Alkaline Phosphatase: 93 IU/L (ref 44–121)
Bilirubin Total: 0.6 mg/dL (ref 0.0–1.2)
Bilirubin, Direct: 0.19 mg/dL (ref 0.00–0.40)
Total Protein: 6.8 g/dL (ref 6.0–8.5)

## 2021-10-29 LAB — HEMOGLOBIN A1C
Est. average glucose Bld gHb Est-mCnc: 111 mg/dL
Hgb A1c MFr Bld: 5.5 % (ref 4.8–5.6)

## 2021-10-29 MED ORDER — METOPROLOL SUCCINATE ER 25 MG PO TB24: 25.0000 mg | ORAL_TABLET | Freq: Every day | ORAL | 3 refills | Status: DC

## 2021-11-01 ENCOUNTER — Encounter: Payer: Self-pay | Admitting: Cardiology

## 2021-11-01 NOTE — Telephone Encounter (Signed)
I don't think the ratio is important. His renal function is normal with a GFR of 97. No concerns whatsoever. ? ?Cherilyn Sautter [place] MD, [facility] ? ?

## 2021-11-03 ENCOUNTER — Encounter (HOSPITAL_BASED_OUTPATIENT_CLINIC_OR_DEPARTMENT_OTHER): Payer: Self-pay | Admitting: Urology

## 2021-11-03 ENCOUNTER — Other Ambulatory Visit: Payer: Self-pay

## 2021-11-03 ENCOUNTER — Emergency Department (HOSPITAL_BASED_OUTPATIENT_CLINIC_OR_DEPARTMENT_OTHER)
Admission: EM | Admit: 2021-11-03 | Discharge: 2021-11-03 | Disposition: A | Discharge status: Home / Self Care | Payer: BC Managed Care – PPO | Attending: Emergency Medicine | Admitting: Emergency Medicine

## 2021-11-03 DIAGNOSIS — Z7982 Long term (current) use of aspirin: Secondary | ICD-10-CM | POA: Diagnosis not present

## 2021-11-03 DIAGNOSIS — S6992XA Unspecified injury of left wrist, hand and finger(s), initial encounter: Secondary | ICD-10-CM | POA: Diagnosis not present

## 2021-11-03 DIAGNOSIS — Z7902 Long term (current) use of antithrombotics/antiplatelets: Secondary | ICD-10-CM | POA: Insufficient documentation

## 2021-11-03 DIAGNOSIS — W275XXA Contact with paper-cutter, initial encounter: Secondary | ICD-10-CM | POA: Insufficient documentation

## 2021-11-03 DIAGNOSIS — S61215A Laceration without foreign body of left ring finger without damage to nail, initial encounter: Secondary | ICD-10-CM | POA: Insufficient documentation

## 2021-11-03 MED ORDER — LIDOCAINE HCL (PF) 1 % IJ SOLN
10.0000 mL | Freq: Once | INTRAMUSCULAR | Status: AC
  Administered 2021-11-03: 10 mL
  Filled 2021-11-03: qty 10

## 2021-11-03 MED ORDER — CEPHALEXIN 500 MG PO CAPS: 500.0000 mg | ORAL_CAPSULE | Freq: Three times a day (TID) | ORAL | 0 refills | Status: AC

## 2021-11-03 NOTE — ED Notes (Signed)
Basic wound care performed to laceration and dressing applied.  Patient discharged to home.  All discharge instructions reviewed.  Patient verbalized understanding via teachback method.  VS WDL.  Respirations even and unlabored.  Ambulatory out of ED.   ?

## 2021-11-03 NOTE — Discharge Instructions (Addendum)
Your laceration was repaired with 4 stitches.  Bleeding was controlled.  You have a dressing in place.  As we discussed you do not lift anything heavy until the stitches come out or take a bath but it is okay for you to take a shower.  If you have any signs of infection including swelling, drainage, or significant redness please return for evaluation or follow-up with your PCP.  Otherwise the stitches will need to come out in about 7 days.  You can return here or follow-up with your PCP to have these taken out. ?

## 2021-11-03 NOTE — ED Notes (Signed)
Finger tourniquet provided to PA per request, size medium used.  ?

## 2021-11-03 NOTE — ED Provider Notes (Addendum)
?[facility] EMERGENCY DEPARTMENT ?Provider Note ? ? ?CSN: [id number] ?Arrival date & time: 11/03/21  1935 ? ?  ? ?History ? ?Chief Complaint  ?Patient presents with  ? Laceration  ? ? ?Kali Dang is a 66 y.o. male. ? ?66 year old male presents today for evaluation of laceration to left ring finger that occurred a few hours prior to arrival.  Patient states he was putting down new tile and using a box cutter when he slipped and cut his left ring finger.  He is on Plavix.  Patient states he put a compression dressing on it for 2 and half hours without success in controlling the bleeding.  Patient denies any other injuries.  States that the box cutter was brand-new and he only use it on the brand-new tile.  States there is no chance any foreign body could be within the laceration.  Last tetanus shot in 2019. ? ?The history is provided by the patient. No language interpreter was used.  ? ?  ? ?Home Medications ?Prior to Admission medications   ?Medication Sig Start Date End Date Taking? Authorizing Provider  ?aspirin EC 81 MG EC tablet Take 1 tablet (81 mg total) by mouth daily. 08/24/12   Theora Gianotti, NP  ?atorvastatin (LIPITOR) 80 MG tablet TAKE ONE TABLET BY MOUTH ONE TIME DAILY AT 6:00PM 09/19/21   Martinique, Peter M, MD  ?clopidogrel (PLAVIX) 75 MG tablet TAKE ONE TABLET BY MOUTH ONE TIME DAILY 09/19/21   Martinique, Peter M, MD  ?co-enzyme Q-10 30 MG capsule Take 30 mg by mouth daily.    [provider]  ?metoprolol succinate (TOPROL-XL) 25 MG 24 hr tablet Take 1 tablet (25 mg total) by mouth daily. 10/29/21   Martinique, Peter M, MD  ?nitroGLYCERIN (NITROSTAT) 0.4 MG SL tablet place 1 tablet (0.4mg  total) under the tongue every 5 mins for 3 doses as needed for chest pain 09/19/21   Martinique, Peter M, MD  ?zolpidem (AMBIEN) 5 MG tablet Take 5 mg by mouth at bedtime as needed for sleep.    [provider]  ?   ? ?Allergies    ?Dilaudid [hydromorphone]   ? ?Review of Systems   ?Review of  Systems  ?Constitutional:  Negative for fever.  ?Skin:  Positive for wound.  ?All other systems reviewed and are negative. ? ?Physical Exam ?Updated Vital Signs ?BP (!) 163/86 (BP Location: Right Arm)   Pulse 67   Temp 98.9 ?F (37.2 ?C) (Oral)   Resp 18   Ht 5' 7.5" (1.715 m)   Wt 88.5 kg   SpO2 100%   BMI 30.09 kg/m?  ?Physical Exam ?Vitals and nursing note reviewed.  ?Constitutional:   ?   General: He is not in acute distress. ?   Appearance: Normal appearance. He is not ill-appearing.  ?HENT:  ?   Head: Normocephalic and atraumatic.  ?   Nose: Nose normal.  ?Eyes:  ?   Conjunctiva/sclera: Conjunctivae normal.  ?Pulmonary:  ?   Effort: Pulmonary effort is normal. No respiratory distress.  ?Musculoskeletal:     ?   General: No deformity.  ?Skin: ?   Findings: No rash.  ?   Comments: 1 cm laceration to distal left ring finger.  Does not involve nailbed.  Active bleeding noted.  Brisk cap refill.  Full range of motion in all digits of the left hand including the left ring finger.  2+ radial pulse present.  ?Neurological:  ?   Mental Status: He is  alert.  ? ? ?ED Results / Procedures / Treatments   ?Labs ?(all labs ordered are listed, but only abnormal results are displayed) ?Labs Reviewed - No data to display ? ?EKG ?None ? ?Radiology ?No results found. ? ?Procedures ?Marland Kitchen.Laceration Repair ? ?Date/Time: 11/03/2021 9:27 PM ?Performed by: Evlyn Courier, PA-C ?Authorized by: Evlyn Courier, PA-C  ? ?Consent:  ?  Consent obtained:  Verbal ?  Consent given by:  Patient ?  Risks discussed:  Infection, need for additional repair, pain, poor cosmetic result and poor wound healing ?  Alternatives discussed:  No treatment and delayed treatment ?Universal protocol:  ?  Procedure explained and questions answered to patient or proxy's satisfaction: yes   ?  Relevant documents present and verified: yes   ?  Patient identity confirmed:  Verbally with patient and arm band ?Anesthesia:  ?  Anesthesia method:  Local infiltration ?  Local  anesthetic:  Lidocaine 1% w/o epi ?Laceration details:  ?  Location:  Finger ?  Finger location:  L ring finger ?  Length (cm):  1 ?Treatment:  ?  Area cleansed with:  Povidone-iodine and saline ?  Amount of cleaning:  Extensive ?  Irrigation solution:  Sterile saline ?  Irrigation volume:  500 ?  Irrigation method:  Pressure wash ?Skin repair:  ?  Repair method:  Sutures ?  Suture size:  5-0 ?  Suture material:  Prolene ?  Suture technique:  Simple interrupted ?  Number of sutures:  4 ?Approximation:  ?  Approximation:  Close ?Repair type:  ?  Repair type:  Simple ?Post-procedure details:  ?  Dressing:  Antibiotic ointment and adhesive bandage ?  Procedure completion:  Tolerated well, no immediate complications  ? ? ?Medications Ordered in ED ?Medications  ?lidocaine (PF) (XYLOCAINE) 1 % injection 10 mL (has no administration in time range)  ? ? ?ED Course/ Medical Decision Making/ A&P ?  ?                        ?Medical Decision Making ?Risk ?Prescription drug management. ? ? ?66 year old male presents today for evaluation of left ring finger laceration that occurred about 3 hours prior to arrival.  Patient is on aspirin and Plavix for previous stent placement.  He applied pressure dressing without success in stopping the bleeding.  Laceration is superficial and about 1 cm in length.  Denies potential retained foreign bodies.  Laceration repaired with 4 stitches.  Laceration care discussed.  Return precautions discussed.  Patient voices understanding and is in agreement with plan.  Given unsure if knife was cleaned will provide with antibiotics.  Patient has good range of motion and is neurovascularly intact. ? ? ?Final Clinical Impression(s) / ED Diagnoses ?Final diagnoses:  ?Laceration of left ring finger without foreign body without damage to nail, initial encounter  ? ? ?Rx / DC Orders ?ED Discharge Orders   ? ?      Ordered  ?  cephALEXin (KEFLEX) 500 MG capsule  3 times daily       ? 11/03/21 2131  ? ?  ?   ? ?  ? ? ?  ?Evlyn Courier, PA-C ?11/03/21 2133 ? ?  ?Evlyn Courier, PA-C ?11/03/21 2133 ? ?  ?Deno Etienne, DO ?11/03/21 2150 ? ?

## 2021-11-03 NOTE — ED Triage Notes (Signed)
Left ring finger laceration from box cutter ?Pt currently on plavix  ?States blood controlled with pressure but when pressure taken off bleeding started again  ? ?

## 2021-11-20 DIAGNOSIS — K649 Unspecified hemorrhoids: Secondary | ICD-10-CM | POA: Diagnosis not present

## 2021-11-20 DIAGNOSIS — D123 Benign neoplasm of transverse colon: Secondary | ICD-10-CM | POA: Diagnosis not present

## 2021-11-20 DIAGNOSIS — Z86010 Personal history of colonic polyps: Secondary | ICD-10-CM | POA: Diagnosis not present

## 2021-11-20 DIAGNOSIS — K5730 Diverticulosis of large intestine without perforation or abscess without bleeding: Secondary | ICD-10-CM | POA: Diagnosis not present

## 2021-11-20 DIAGNOSIS — D120 Benign neoplasm of cecum: Secondary | ICD-10-CM | POA: Diagnosis not present

## 2021-12-12 DIAGNOSIS — H2513 Age-related nuclear cataract, bilateral: Secondary | ICD-10-CM | POA: Diagnosis not present

## 2021-12-12 DIAGNOSIS — D3131 Benign neoplasm of right choroid: Secondary | ICD-10-CM | POA: Diagnosis not present

## 2021-12-12 DIAGNOSIS — H40013 Open angle with borderline findings, low risk, bilateral: Secondary | ICD-10-CM | POA: Diagnosis not present

## 2022-03-06 DIAGNOSIS — B029 Zoster without complications: Secondary | ICD-10-CM | POA: Diagnosis not present

## 2022-03-10 ENCOUNTER — Encounter: Payer: Self-pay | Admitting: Cardiology

## 2022-03-11 MED ORDER — PANTOPRAZOLE SODIUM 40 MG PO TBEC: 40.0000 mg | DELAYED_RELEASE_TABLET | Freq: Every day | ORAL | 1 refills | Status: DC

## 2022-03-11 NOTE — Addendum Note (Signed)
Addended by: Darene Lamer T on: 03/11/2022 11:27 AM   Modules accepted: Orders

## 2022-05-28 DIAGNOSIS — M79661 Pain in right lower leg: Secondary | ICD-10-CM | POA: Diagnosis not present

## 2022-05-30 DIAGNOSIS — M79661 Pain in right lower leg: Secondary | ICD-10-CM | POA: Diagnosis not present

## 2022-06-03 DIAGNOSIS — M79661 Pain in right lower leg: Secondary | ICD-10-CM | POA: Diagnosis not present

## 2022-06-06 DIAGNOSIS — M79661 Pain in right lower leg: Secondary | ICD-10-CM | POA: Diagnosis not present

## 2022-06-09 DIAGNOSIS — M79661 Pain in right lower leg: Secondary | ICD-10-CM | POA: Diagnosis not present

## 2022-06-11 DIAGNOSIS — M79661 Pain in right lower leg: Secondary | ICD-10-CM | POA: Diagnosis not present

## 2022-06-18 DIAGNOSIS — H2513 Age-related nuclear cataract, bilateral: Secondary | ICD-10-CM | POA: Diagnosis not present

## 2022-06-18 DIAGNOSIS — D3131 Benign neoplasm of right choroid: Secondary | ICD-10-CM | POA: Diagnosis not present

## 2022-06-18 DIAGNOSIS — H40013 Open angle with borderline findings, low risk, bilateral: Secondary | ICD-10-CM | POA: Diagnosis not present

## 2022-06-19 DIAGNOSIS — M79661 Pain in right lower leg: Secondary | ICD-10-CM | POA: Diagnosis not present

## 2022-07-02 DIAGNOSIS — M79661 Pain in right lower leg: Secondary | ICD-10-CM | POA: Diagnosis not present

## 2022-07-07 DIAGNOSIS — M79661 Pain in right lower leg: Secondary | ICD-10-CM | POA: Diagnosis not present

## 2022-08-28 ENCOUNTER — Encounter: Payer: Self-pay | Admitting: Cardiology

## 2022-08-28 ENCOUNTER — Other Ambulatory Visit (HOSPITAL_BASED_OUTPATIENT_CLINIC_OR_DEPARTMENT_OTHER): Payer: Self-pay | Admitting: Family Medicine

## 2022-08-28 DIAGNOSIS — E559 Vitamin D deficiency, unspecified: Secondary | ICD-10-CM | POA: Diagnosis not present

## 2022-08-28 DIAGNOSIS — Z125 Encounter for screening for malignant neoplasm of prostate: Secondary | ICD-10-CM | POA: Diagnosis not present

## 2022-08-28 DIAGNOSIS — I2510 Atherosclerotic heart disease of native coronary artery without angina pectoris: Secondary | ICD-10-CM | POA: Diagnosis not present

## 2022-08-28 DIAGNOSIS — E7800 Pure hypercholesterolemia, unspecified: Secondary | ICD-10-CM | POA: Diagnosis not present

## 2022-08-28 DIAGNOSIS — I10 Essential (primary) hypertension: Secondary | ICD-10-CM | POA: Diagnosis not present

## 2022-08-28 DIAGNOSIS — D6869 Other thrombophilia: Secondary | ICD-10-CM | POA: Diagnosis not present

## 2022-08-28 DIAGNOSIS — N50812 Left testicular pain: Secondary | ICD-10-CM

## 2022-08-28 DIAGNOSIS — Z0000 Encounter for general adult medical examination without abnormal findings: Secondary | ICD-10-CM | POA: Diagnosis not present

## 2022-08-29 ENCOUNTER — Other Ambulatory Visit: Payer: Self-pay | Admitting: Family Medicine

## 2022-08-29 DIAGNOSIS — N50812 Left testicular pain: Secondary | ICD-10-CM

## 2022-09-01 ENCOUNTER — Ambulatory Visit
Admission: RE | Admit: 2022-09-01 | Discharge: 2022-09-01 | Disposition: A | Discharge status: Home / Self Care | Payer: BC Managed Care – PPO | Source: Ambulatory Visit | Attending: Family Medicine | Admitting: Family Medicine

## 2022-09-01 DIAGNOSIS — N50812 Left testicular pain: Secondary | ICD-10-CM

## 2022-09-03 ENCOUNTER — Other Ambulatory Visit: Payer: Self-pay | Admitting: Cardiology

## 2022-10-18 ENCOUNTER — Other Ambulatory Visit: Payer: Self-pay | Admitting: Cardiology

## 2022-10-20 NOTE — Progress Notes (Unsigned)
David Pena Date of Birth: 1956-03-19 Medical Record [id number]  History of Present Illness: David Pena is seen back today for follow up CAD.  He presented in January of 2014 with anterior STEMI and emergent stenting of the proximal LAD with a 3.0 x 1mm Xience followed by a staged PCI of the PDA with a 2.25 x 2mm Promus. EF was normal. Other issues include HLD, HTN and past DVT following knee surgery back in 2005. He had a normal Myoview in March  2015 with EF 65%. 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 Repeat ETT in January 2019 was also normal.   On follow up today he is doing well. Denies any dyspnea or palpitations. No chest pain.  He has done much better with his diet this year and has lost 21 lbs. No concentrated sweets. Is dealing with pain in his foot- followed by ortho.       Current Outpatient Medications  Medication Sig Dispense Refill   aspirin EC 81 MG EC tablet Take 1 tablet (81 mg total) by mouth daily.     atorvastatin (LIPITOR) 80 MG tablet TAKE ONE TABLET BY MOUTH ONE TIME DAILY AT 6:00PM 90 tablet 3   clopidogrel (PLAVIX) 75 MG tablet TAKE ONE TABLET BY MOUTH ONE TIME DAILY 90 tablet 3   co-enzyme Q-10 30 MG capsule Take 30 mg by mouth daily.     nitroGLYCERIN (NITROSTAT) 0.4 MG SL tablet place 1 tablet (0.4mg  total) under the tongue every 5 mins for 3 doses as needed for chest pain 25 tablet 7   zolpidem (AMBIEN) 5 MG tablet Take 5 mg by mouth at bedtime as needed for sleep.     metoprolol succinate (TOPROL-XL) 25 MG 24 hr tablet Take 1 tablet (25 mg total) by mouth daily. 90 tablet 3   No current facility-administered medications for this visit.    Allergies  Allergen Reactions   Dilaudid [Hydromorphone] Other (See Comments)    Night sweats and terrors    Past Medical History:  Diagnosis Date   CAD (coronary artery disease)    a. 08/2012 Ant STEMI/Cath/PCI: LM nl, LAD  100 (3.0x33 Xience Expedition DES), LCX 30p, RCA 36m, PDA 90 (staged PCI w/ 2.25x12 Promus DES on 1/20), EF 55%;  b. 08/2012 Echo: 55-60%, mild LVH, no rwma.   History of DVT of lower extremity    a. RLE following knee surgery in 2005   Hypertension    Old MI (myocardial infarction)    Osteoarthritis    a. s/p arthroscopic surgery - R knee   PONV (postoperative nausea and vomiting)    PONV (postoperative nausea and vomiting)    Sleep apnea    on CPAP    Past Surgical History:  Procedure Laterality Date   LEFT HEART CATHETERIZATION WITH CORONARY ANGIOGRAM N/A 08/20/2012   Procedure: LEFT HEART CATHETERIZATION WITH CORONARY ANGIOGRAM;  Surgeon: Larey Dresser, MD;  Location: [facility] CATH LAB;  Service: Cardiovascular;  Laterality: N/A;   PERCUTANEOUS CORONARY STENT INTERVENTION (PCI-S) N/A 08/23/2012   Procedure: PERCUTANEOUS CORONARY STENT INTERVENTION (PCI-S);  Surgeon: Favio Moder M Martin[place], MD;  Location: [facility] CATH LAB;  Service: Cardiovascular;  Laterality: N/A;   Right Knee Arthroscopy     a. 2005    Social History   Tobacco Use  Smoking Status Never  Smokeless Tobacco Never    Social History   Substance and Sexual  Activity  Alcohol Use No    Family History  Problem Relation Age of Onset   Heart failure Father        died in his 63's.   High Cholesterol Mother        alive @ 64.    Review of Systems: The review of systems is per the HPI.  All other systems were reviewed and are negative.  Physical Exam: BP 120/80 (BP Location: Left Arm)   Pulse (!) 52   Ht 5' 7.5" (1.715 m)   Wt 200 lb 3.2 oz (90.8 kg)   SpO2 93%   BMI 30.89 kg/m  GENERAL:  [name] HEENT:  PERRL, EOMI, sclera are clear. Oropharynx is clear. NECK:  No jugular venous distention, carotid upstroke brisk and symmetric, no bruits, no thyromegaly or adenopathy LUNGS:  Clear to auscultation bilaterally CHEST:  Unremarkable HEART:  RRR,  PMI not displaced or sustained,S1 and S2  within normal limits, no S3, no S4: no clicks, no rubs, no murmurs ABD:  Soft, nontender. BS +, no masses or bruits. No hepatomegaly, no splenomegaly EXT:  2 + pulses throughout, no edema, no cyanosis no clubbing SKIN:  Warm and dry.  No rashes NEURO:  Alert and oriented x 3. Cranial nerves II through XII intact. PSYCH:  Cognitively intact      LABORATORY DATA:   Lab Results  Component Value Date   WBC 6.5 10/29/2020   HGB 15.2 10/29/2020   HCT 45.5 10/29/2020   PLT 251 10/29/2020   GLUCOSE 120 (H) 10/29/2020   CHOL 108 10/29/2020   TRIG 93 10/29/2020   HDL 31 (L) 10/29/2020   LDLCALC 59 10/29/2020   ALT 49 (H) 10/29/2020   AST 25 10/29/2020   NA 141 10/29/2020   K 4.7 10/29/2020   CL 104 10/29/2020   CREATININE 0.86 10/29/2020   BUN 17 10/29/2020   CO2 23 10/29/2020   TSH 0.87 09/05/2013   INR 1.21 08/20/2012   HGBA1C 6.5 (H) 10/29/2020   Labs dated 03/06/16: cholesterol 107, triglycerides 70, LDL 62, HDL 31. CMET normal. Dated 08/15/19: cholesterol 109, triglycerides 90, HDL 30, LDL 61. CBC and CMET normal Dated 08/28/22: cholesterol 92, triglycerides 105, HDL 30, LDL 42. CBC and CMET normal.    Ecg today shows NSR with HR 52. Normal. I have personally reviewed and interpreted this study.   ETT 08/19/17: Study Highlights   There was less than 0.30mm of horizontal to upsloping ST segment depression in the lateral precordial leads. SBP dropped 40mmHg from Stage 2 to Stage 3. Excellent exercise capacity. The patient reached 12.2 mets and 96% of MPHR with no CP. This is a low risk study.    Assessment / Plan: 1. CAD s/p anterior STEMI in January 2014 with DES of the LAD and later of the PDA. Normal myoview in March 2015. Normal ETT March 2016 and January 2019. He is still asymptomatic. Recommend DAPT indefinitely since he is tolerating it well without bleeding or bruising. Follow up in one year.  2. HTN - well controlled on low dose metoprolol  3. HLD - continue high  dose statin. Update labs today. Encouraged with dietary modification and weight loss.  4. Obesity.   Follow up in one year.

## 2022-10-22 ENCOUNTER — Ambulatory Visit: Payer: BC Managed Care – PPO | Attending: Cardiology | Admitting: Cardiology

## 2022-10-22 ENCOUNTER — Encounter: Payer: Self-pay | Admitting: Cardiology

## 2022-10-22 VITALS — BP 106/62 | HR 69 | Ht 67.0 in | Wt 208.0 lb

## 2022-10-22 DIAGNOSIS — E7800 Pure hypercholesterolemia, unspecified: Secondary | ICD-10-CM | POA: Diagnosis not present

## 2022-10-22 DIAGNOSIS — I10 Essential (primary) hypertension: Secondary | ICD-10-CM

## 2022-10-22 DIAGNOSIS — I2510 Atherosclerotic heart disease of native coronary artery without angina pectoris: Secondary | ICD-10-CM

## 2022-10-22 MED ORDER — CLOPIDOGREL BISULFATE 75 MG PO TABS
75.0000 mg | ORAL_TABLET | Freq: Every day | ORAL | 3 refills | Status: DC
Start: 2022-10-22 — End: 2023-10-01

## 2022-10-22 MED ORDER — ATORVASTATIN CALCIUM 80 MG PO TABS: 80.0000 mg | ORAL_TABLET | Freq: Every day | ORAL | 3 refills | Status: DC

## 2022-10-22 MED ORDER — NITROGLYCERIN 0.4 MG SL SUBL: SUBLINGUAL_TABLET | SUBLINGUAL | 7 refills | Status: DC

## 2022-10-22 MED ORDER — METOPROLOL SUCCINATE ER 25 MG PO TB24: 25.0000 mg | ORAL_TABLET | Freq: Every day | ORAL | 3 refills | Status: DC

## 2023-06-25 DIAGNOSIS — G4733 Obstructive sleep apnea (adult) (pediatric): Secondary | ICD-10-CM | POA: Diagnosis not present

## 2023-08-20 DIAGNOSIS — D3131 Benign neoplasm of right choroid: Secondary | ICD-10-CM | POA: Diagnosis not present

## 2023-08-20 DIAGNOSIS — H40013 Open angle with borderline findings, low risk, bilateral: Secondary | ICD-10-CM | POA: Diagnosis not present

## 2023-08-20 DIAGNOSIS — H2513 Age-related nuclear cataract, bilateral: Secondary | ICD-10-CM | POA: Diagnosis not present

## 2023-10-01 ENCOUNTER — Other Ambulatory Visit: Payer: Self-pay | Admitting: Cardiology

## 2024-01-10 ENCOUNTER — Other Ambulatory Visit: Payer: Self-pay | Admitting: Cardiology

## 2024-01-11 ENCOUNTER — Encounter: Payer: Self-pay | Admitting: Cardiology

## 2024-01-13 NOTE — Telephone Encounter (Signed)
 Called patient left message on personal voice mail to call back to discuss mychart message.

## 2024-01-14 ENCOUNTER — Other Ambulatory Visit: Payer: Self-pay

## 2024-01-14 DIAGNOSIS — I2510 Atherosclerotic heart disease of native coronary artery without angina pectoris: Secondary | ICD-10-CM

## 2024-01-14 MED ORDER — CLOPIDOGREL BISULFATE 75 MG PO TABS: 75.0000 mg | ORAL_TABLET | Freq: Every day | ORAL | 3 refills | Status: AC

## 2024-01-14 MED ORDER — NITROGLYCERIN 0.4 MG SL SUBL: SUBLINGUAL_TABLET | SUBLINGUAL | 0 refills | Status: DC

## 2024-01-14 MED ORDER — METOPROLOL SUCCINATE ER 25 MG PO TB24: 25.0000 mg | ORAL_TABLET | Freq: Every day | ORAL | 3 refills | Status: AC

## 2024-01-14 MED ORDER — NITROGLYCERIN 0.4 MG SL SUBL: SUBLINGUAL_TABLET | SUBLINGUAL | 11 refills | Status: AC

## 2024-01-14 MED ORDER — ATORVASTATIN CALCIUM 80 MG PO TABS: 80.0000 mg | ORAL_TABLET | Freq: Every day | ORAL | 3 refills | Status: AC

## 2024-01-14 NOTE — Telephone Encounter (Signed)
 Spoke to patient stated he has been having occasional tightness in left arm for the past 2 weeks.Stated he woke up this past Saturday at 2:00 am with severe tightness in left arm.Tightness lasted over 4 hours.He has some tightness in left arm since but not as bad.He did not take NTG.Stated his B/P has been elevated ranging 161/91,140/80,130/80.Pulse 70's.He has been under a lot of stress the past 8 weeks.He is presently at [facility].Advised if he has anymore left arm tightness take a NTG.Advised he can take 1 tablet under tongue every 5 min apart up to 3 tablets,if tightness not relieved after 3 tablets he will need to go to ED.Dr.Jordan is out of office.I will speak to him about appointment and you  call back.

## 2024-01-18 NOTE — Telephone Encounter (Signed)
 Spoke to patient Dr.Jordan advised will need appointment.Appointment scheduled with Dr.Jordan 6/20 at 10:00 am.

## 2024-01-20 NOTE — Progress Notes (Signed)
 David Pena Date of Birth: [birth year] Medical Record #[id number]  History of Present Illness: [name] is seen back today for follow up CAD.  He presented in January of 2014 with anterior STEMI and emergent stenting of the proximal LAD with a 3.0 x 33mm Xience followed by a staged PCI of the PDA with a 2.25 x 12mm Promus. EF was normal. Other issues include HLD, HTN and past DVT following knee surgery back in 2005. He had a normal Myoview in March  2015 with EF 65%. 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 Repeat ETT in January 2019 was also normal.   On follow up today he noted a couple of weeks ago he had tightness in his left arm that awoke him from sleep. Lasted 4-5 days. Does note more fatigue. No real chest pain. Labs in Feb showed LDL 57, HDL 30. A1c 6.2%.       Current Outpatient Medications  Medication Sig Dispense Refill   aspirin  EC 81 MG EC tablet Take 1 tablet (81 mg total) by mouth daily.     atorvastatin  (LIPITOR ) 80 MG tablet Take 1 tablet (80 mg total) by mouth daily. 90 tablet 3   clopidogrel  (PLAVIX ) 75 MG tablet Take 1 tablet (75 mg total) by mouth daily. 90 tablet 3   metoprolol  succinate (TOPROL -XL) 25 MG 24 hr tablet Take 1 tablet (25 mg total) by mouth daily. 90 tablet 3   zolpidem  (AMBIEN ) 5 MG tablet Take 5 mg by mouth at bedtime as needed for sleep.     nitroGLYCERIN  (NITROSTAT ) 0.4 MG SL tablet place 1 tablet (0.4mg  total) under the tongue every 5 mins for 3 doses as needed for chest pain (Patient not taking: Reported on 01/22/2024) 25 tablet 11   No current facility-administered medications for this visit.    Allergies  Allergen Reactions   Dilaudid  [Hydromorphone ] Other (See Comments)    Night sweats and terrors    Past Medical History:  Diagnosis Date   CAD (coronary artery disease)    a. 08/2012 Ant STEMI/Cath/PCI: LM nl, LAD 100 (3.0x33 Xience Expedition DES), LCX  30p, RCA 58m, PDA 90 (staged PCI w/ 2.25x12 Promus DES on 1/20), EF 55%;  b. 08/2012 Echo: 55-60%, mild LVH, no rwma.   History of DVT of lower extremity    a. RLE following knee surgery in 2005   Hypertension    Old MI (myocardial infarction)    Osteoarthritis    a. s/p arthroscopic surgery - R knee   PONV (postoperative nausea and vomiting)    PONV (postoperative nausea and vomiting)    Sleep apnea    on CPAP    Past Surgical History:  Procedure Laterality Date   LEFT HEART CATHETERIZATION WITH CORONARY ANGIOGRAM N/A 08/20/2012   Procedure: LEFT HEART CATHETERIZATION WITH CORONARY ANGIOGRAM;  Surgeon: Darlis Eisenmenger, MD;  Location: [facility] CATH LAB;  Service: Cardiovascular;  Laterality: N/A;   PERCUTANEOUS CORONARY STENT INTERVENTION (PCI-S) N/A 08/23/2012   Procedure: PERCUTANEOUS CORONARY STENT INTERVENTION (PCI-S);  Surgeon: Witney Huie M [place], MD;  Location: [facility] CATH LAB;  Service: Cardiovascular;  Laterality: N/A;   Right Knee Arthroscopy     a. 2005    Social History   Tobacco Use  Smoking Status Never  Smokeless Tobacco Never    Social History   Substance and Sexual Activity  Alcohol Use No    Family History  Problem Relation Age of Onset   Heart failure Father        died in his 80's.   High Cholesterol Mother        alive @ 13.    Review of Systems: The review of systems is per the HPI.  All other systems were reviewed and are negative.  Physical Exam: BP (!) 142/80 (BP Location: Left Arm, Patient Position: Sitting, Cuff Size: Large)   Pulse 67   Wt 215 lb (97.5 kg)   SpO2 96%   BMI 33.67 kg/m  GENERAL:  [name] HEENT:  PERRL, EOMI, sclera are clear. Oropharynx is clear. NECK:  No jugular venous distention, carotid upstroke brisk and symmetric, no bruits, no thyromegaly or adenopathy LUNGS:  Clear to auscultation bilaterally CHEST:  Unremarkable HEART:  RRR,  PMI not displaced or sustained,S1 and S2 within normal limits, no S3, no  S4: no clicks, no rubs, no murmurs ABD:  Soft, nontender. BS +, no masses or bruits. No hepatomegaly, no splenomegaly EXT:  2 + pulses throughout, no edema, no cyanosis no clubbing SKIN:  Warm and dry.  No rashes NEURO:  Alert and oriented x 3. Cranial nerves II through XII intact. PSYCH:  Cognitively intact      LABORATORY DATA:   Lab Results  Component Value Date   WBC 5.8 10/29/2021   HGB 14.9 10/29/2021   HCT 43.2 10/29/2021   PLT 216 10/29/2021   GLUCOSE 92 10/29/2021   CHOL 90 (L) 10/29/2021   TRIG 71 10/29/2021   HDL 29 (L) 10/29/2021   LDLCALC 46 10/29/2021   ALT 39 10/29/2021   AST 25 10/29/2021   NA 143 10/29/2021   K 4.9 10/29/2021   CL 105 10/29/2021   CREATININE 0.85 10/29/2021   BUN 27 10/29/2021   CO2 27 10/29/2021   TSH 0.87 09/05/2013   INR 1.21 08/20/2012   HGBA1C 5.5 10/29/2021   Labs dated 03/06/16: cholesterol 107, triglycerides 70, LDL 62, HDL 31. CMET normal. Dated 08/15/19: cholesterol 109, triglycerides 90, HDL 30, LDL 61. CBC and CMET normal Dated 08/28/22: cholesterol 92, triglycerides 105, HDL 30, LDL 42. CBC and CMET normal.    EKG Interpretation Date/Time:  Friday January 22 2024 09:58:14 EDT Ventricular Rate:  67 PR Interval:  194 QRS Duration:  90 QT Interval:  394 QTC Calculation: 416 R Axis:   45  Text Interpretation: Normal sinus rhythm Normal ECG When compared with ECG of October 22, 2022 No significant change was found Confirmed by [place], Grecia Lynk [phone]) on 01/22/2024 9:59:41 AM     ETT 08/19/17: Study Highlights   There was less than 0.49mm of horizontal to upsloping ST segment depression in the lateral precordial leads. SBP dropped from Stage 2 to Stage 3. Excellent exercise capacity. The patient reached 12.2 mets and 96% of MPHR with no CP. This is a low risk study.    Assessment / Plan: 1. CAD s/p anterior STEMI in January 2014 with DES of the LAD and later of the PDA. Normal myoview in March 2015. Normal ETT March  2016 and January 2019. He has some recent fatigue and left arm discomfort. Recommend ischemic evaluation with stress Myoview.   2. HTN - BP is mildly elevated. He reports normally BP is ok. Will monitor for now. If persistently elevated would add ARB  3. HLD - continue high dose statin. LDL is at 57 with goal of 55.   4. Obesity.

## 2024-01-22 ENCOUNTER — Ambulatory Visit: Attending: Cardiology | Admitting: Cardiology

## 2024-01-22 VITALS — BP 142/80 | HR 67 | Wt 215.0 lb

## 2024-01-22 DIAGNOSIS — E7800 Pure hypercholesterolemia, unspecified: Secondary | ICD-10-CM

## 2024-01-22 DIAGNOSIS — I25118 Atherosclerotic heart disease of native coronary artery with other forms of angina pectoris: Secondary | ICD-10-CM | POA: Diagnosis not present

## 2024-01-22 DIAGNOSIS — I10 Essential (primary) hypertension: Secondary | ICD-10-CM | POA: Diagnosis not present

## 2024-01-22 NOTE — Patient Instructions (Addendum)
 Medication Instructions:  Continue same medications  *If you need a refill on your cardiac medications before your next appointment, please call your pharmacy*  Lab Work: None ordered  Testing/Procedures: Schedule Stress Myoview Your physician has requested that you have en exercise stress myoview. For further information please visit [email or web address] Please follow instruction sheet, as given. Hold Metoprolol  morning of test   No Caffeine   No food 4 hours before Follow-Up: At [facility], you and your health needs are our priority.  As part of our continuing mission to provide you with exceptional heart care, our providers are all part of one team.  This team includes your primary Cardiologist (physician) and Advanced Practice Providers or APPs (Physician Assistants and Nurse Practitioners) who all work together to provide you with the care you need, when you need it.  Your next appointment:  1 year   Call in March to schedule June appointment     Provider:  Dr.Jordan   We recommend signing up for the patient portal called MyChart.  Sign up information is provided on this After Visit Summary.  MyChart is used to connect with patients for Virtual Visits (Telemedicine).  Patients are able to view lab/test results, encounter notes, upcoming appointments, etc.  Non-urgent messages can be sent to your provider as well.   To learn more about what you can do with MyChart, go to [email or web address].

## 2024-01-25 ENCOUNTER — Encounter (HOSPITAL_COMMUNITY): Payer: Self-pay

## 2024-01-25 ENCOUNTER — Other Ambulatory Visit: Payer: Self-pay | Admitting: Cardiology

## 2024-01-25 DIAGNOSIS — I10 Essential (primary) hypertension: Secondary | ICD-10-CM

## 2024-01-25 DIAGNOSIS — I25118 Atherosclerotic heart disease of native coronary artery with other forms of angina pectoris: Secondary | ICD-10-CM

## 2024-01-25 DIAGNOSIS — E7800 Pure hypercholesterolemia, unspecified: Secondary | ICD-10-CM

## 2024-01-27 ENCOUNTER — Ambulatory Visit (HOSPITAL_COMMUNITY)
Admission: RE | Admit: 2024-01-27 | Discharge: 2024-01-27 | Disposition: A | Discharge status: Home / Self Care | Payer: Self-pay | Source: Ambulatory Visit | Attending: Cardiology | Admitting: Cardiology

## 2024-01-27 DIAGNOSIS — I25118 Atherosclerotic heart disease of native coronary artery with other forms of angina pectoris: Secondary | ICD-10-CM | POA: Insufficient documentation

## 2024-01-27 DIAGNOSIS — I10 Essential (primary) hypertension: Secondary | ICD-10-CM | POA: Insufficient documentation

## 2024-01-27 DIAGNOSIS — E7800 Pure hypercholesterolemia, unspecified: Secondary | ICD-10-CM | POA: Diagnosis not present

## 2024-01-27 LAB — MYOCARDIAL PERFUSION IMAGING
Angina Index: 0
Duke Treadmill Score: 8
Estimated workload: 10.1
Exercise duration (min): 8 min
Exercise duration (sec): 26 s
LV dias vol: 94 mL (ref 62–150)
LV sys vol: 22 mL (ref 4.2–5.8)
MPHR: 154 {beats}/min
Nuc Stress EF: 77 %
Peak HR: 133 {beats}/min
Percent HR: 86 %
Rest HR: 64 {beats}/min
Rest Nuclear Isotope Dose: 10.2 mCi
SDS: 0
SRS: 0
SSS: 0
ST Depression (mm): 0 mm
Stress Nuclear Isotope Dose: 32.9 mCi
TID: 0.81

## 2024-01-27 MED ORDER — TECHNETIUM TC 99M TETROFOSMIN IV KIT
10.2000 | PACK | Freq: Once | INTRAVENOUS | Status: AC | PRN
  Administered 2024-01-27: 10.2 via INTRAVENOUS

## 2024-01-27 MED ORDER — TECHNETIUM TC 99M TETROFOSMIN IV KIT
32.9000 | PACK | Freq: Once | INTRAVENOUS | Status: AC | PRN
  Administered 2024-01-27: 32.9 via INTRAVENOUS

## 2024-01-28 ENCOUNTER — Ambulatory Visit: Payer: Self-pay | Admitting: Cardiology

## 2024-01-29 NOTE — Telephone Encounter (Signed)
 Spoke to patient myoview  results given.He will monitor B/P daily and call back if B/P elevated.

## 2024-02-01 ENCOUNTER — Ambulatory Visit: Payer: Self-pay | Admitting: Cardiology

## 2024-03-30 ENCOUNTER — Telehealth: Payer: Self-pay | Admitting: Cardiology

## 2024-03-30 NOTE — Telephone Encounter (Signed)
 Signe called and asked if we can provide this information to the patient. If the patient called us  or see us  for an appt   Signe RN [employer]  [phone]
